# Patient Record
Sex: Male | Born: 1970 | Race: White | Hispanic: No | Marital: Married | Smoking: Current every day smoker
Health system: Southern US, Community
[De-identification: ages and names within clinical notes are randomized; demographics above are authoritative.]

## PROBLEM LIST (undated history)

## (undated) DIAGNOSIS — K760 Fatty (change of) liver, not elsewhere classified: Secondary | ICD-10-CM

## (undated) DIAGNOSIS — J939 Pneumothorax, unspecified: Secondary | ICD-10-CM

## (undated) DIAGNOSIS — K589 Irritable bowel syndrome without diarrhea: Secondary | ICD-10-CM

## (undated) DIAGNOSIS — R112 Nausea with vomiting, unspecified: Secondary | ICD-10-CM

## (undated) DIAGNOSIS — R251 Tremor, unspecified: Secondary | ICD-10-CM

## (undated) DIAGNOSIS — F4310 Post-traumatic stress disorder, unspecified: Secondary | ICD-10-CM

## (undated) DIAGNOSIS — F419 Anxiety disorder, unspecified: Secondary | ICD-10-CM

## (undated) DIAGNOSIS — R51 Headache: Secondary | ICD-10-CM

## (undated) DIAGNOSIS — F32A Depression, unspecified: Secondary | ICD-10-CM

## (undated) DIAGNOSIS — F329 Major depressive disorder, single episode, unspecified: Secondary | ICD-10-CM

## (undated) DIAGNOSIS — K648 Other hemorrhoids: Secondary | ICD-10-CM

## (undated) DIAGNOSIS — K219 Gastro-esophageal reflux disease without esophagitis: Secondary | ICD-10-CM

## (undated) DIAGNOSIS — R519 Headache, unspecified: Secondary | ICD-10-CM

## (undated) DIAGNOSIS — G8929 Other chronic pain: Secondary | ICD-10-CM

## (undated) DIAGNOSIS — G4730 Sleep apnea, unspecified: Secondary | ICD-10-CM

## (undated) DIAGNOSIS — K529 Noninfective gastroenteritis and colitis, unspecified: Secondary | ICD-10-CM

## (undated) HISTORY — DX: Headache: R51

## (undated) HISTORY — DX: Headache, unspecified: R51.9

## (undated) HISTORY — DX: Gastro-esophageal reflux disease without esophagitis: K21.9

## (undated) HISTORY — DX: Sleep apnea, unspecified: G47.30

## (undated) HISTORY — DX: Other hemorrhoids: K64.8

## (undated) HISTORY — PX: NECK SURGERY: SHX720

## (undated) HISTORY — DX: Nausea with vomiting, unspecified: R11.2

## (undated) HISTORY — PX: UPPER GASTROINTESTINAL ENDOSCOPY: SHX188

## (undated) HISTORY — DX: Fatty (change of) liver, not elsewhere classified: K76.0

## (undated) HISTORY — DX: Depression, unspecified: F32.A

## (undated) HISTORY — PX: BACK SURGERY: SHX140

## (undated) HISTORY — PX: WISDOM TOOTH EXTRACTION: SHX21

## (undated) HISTORY — DX: Other chronic pain: G89.29

## (undated) HISTORY — DX: Pneumothorax, unspecified: J93.9

## (undated) HISTORY — DX: Major depressive disorder, single episode, unspecified: F32.9

## (undated) HISTORY — PX: TONSILLECTOMY: SUR1361

## (undated) HISTORY — DX: Anxiety disorder, unspecified: F41.9

## (undated) HISTORY — DX: Post-traumatic stress disorder, unspecified: F43.10

## (undated) HISTORY — DX: Irritable bowel syndrome, unspecified: K58.9

## (undated) HISTORY — DX: Noninfective gastroenteritis and colitis, unspecified: K52.9

---

## 1993-01-16 HISTORY — PX: IMPLANTATION / PLACEMENT EPIDURAL NEUROSTIMULATOR ELECTRODES: SUR687

## 1993-01-16 HISTORY — PX: LUNG REMOVAL, PARTIAL: SHX233

## 2009-01-16 DIAGNOSIS — K648 Other hemorrhoids: Secondary | ICD-10-CM

## 2009-01-16 HISTORY — DX: Other hemorrhoids: K64.8

## 2009-08-02 ENCOUNTER — Emergency Department (HOSPITAL_COMMUNITY): Admission: EM | Admit: 2009-08-02 | Discharge: 2009-08-03 | Payer: Self-pay | Admitting: Emergency Medicine

## 2009-08-10 ENCOUNTER — Ambulatory Visit: Payer: Self-pay | Admitting: Gastroenterology

## 2009-08-10 ENCOUNTER — Encounter: Payer: Self-pay | Admitting: Gastroenterology

## 2009-08-10 DIAGNOSIS — K219 Gastro-esophageal reflux disease without esophagitis: Secondary | ICD-10-CM

## 2009-08-10 DIAGNOSIS — K589 Irritable bowel syndrome without diarrhea: Secondary | ICD-10-CM

## 2009-08-10 DIAGNOSIS — R1310 Dysphagia, unspecified: Secondary | ICD-10-CM | POA: Insufficient documentation

## 2009-08-19 ENCOUNTER — Ambulatory Visit (HOSPITAL_COMMUNITY): Admission: RE | Admit: 2009-08-19 | Discharge: 2009-08-19 | Payer: Self-pay | Admitting: Gastroenterology

## 2009-08-19 ENCOUNTER — Ambulatory Visit: Payer: Self-pay | Admitting: Gastroenterology

## 2009-09-22 ENCOUNTER — Emergency Department (HOSPITAL_COMMUNITY)
Admission: EM | Admit: 2009-09-22 | Discharge: 2009-09-22 | Payer: Self-pay | Source: Home / Self Care | Admitting: Emergency Medicine

## 2009-11-12 ENCOUNTER — Encounter (INDEPENDENT_AMBULATORY_CARE_PROVIDER_SITE_OTHER): Payer: Self-pay | Admitting: *Deleted

## 2009-12-31 ENCOUNTER — Emergency Department (HOSPITAL_COMMUNITY)
Admission: EM | Admit: 2009-12-31 | Discharge: 2009-12-31 | Payer: Self-pay | Source: Home / Self Care | Admitting: Emergency Medicine

## 2010-02-15 NOTE — Letter (Signed)
Summary: Recall Office Visit  [facility]  [address]   [place], [place] [address]   Phone: [phone]  Fax: [phone]      November 12, 2009   EVO ADERMAN [address] [place], [place]  [address] 21-Nov-1970   Dear Mr. Duling,   According to our records, it is time for you to schedule a follow-up office visit with [place].   At your convenience, please call [phone] to schedule an office visit. If you have any questions, concerns, or feel that this letter is in error, we would appreciate your call.   Sincerely,    Diana Eves  [facility] Ph: [phone]   Fax: [phone]

## 2010-02-15 NOTE — Letter (Signed)
Summary: REFERRAL FROM [facility]  REFERRAL FROM [facility] TYSINGER,PA-C   Imported By: Rexene Alberts 08/10/2009 16:39:02  _____________________________________________________________________  External Attachment:    Type:   Image     Comment:   External Document

## 2010-02-15 NOTE — Letter (Signed)
Summary: Internal Other Norman Zamora FOR EGD/ED  Internal Other Norman Zamora FOR EGD/ED   Imported By: Cloria Spring LPN 45/40/9811 91:47:82  _____________________________________________________________________  External Attachment:    Type:   Image     Comment:   External Document

## 2010-02-15 NOTE — Assessment & Plan Note (Signed)
Summary: ABD PAIN,VOMITING,NAUSEA,DYSPHAGIA/SS   Visit Type:  Consult Referring Provider:  [facility], Norman Zamora Primary Care Provider:  [facility]  Chief Complaint:  abd pain, N/V, and difficultly swallowing.  History of Present Illness: Norman Zamora is a pleasant 40 y/o WM, patient of Norman Covey, Norman Zamora, who presents for further evaluation of abd pain, diarrhea, dysphagia, N/V. He has h/o IBS diagnosed while in [place]. He reports prior TCS in 2009 which was unremarkable. He rarely has constipation. BM 2-3 times per day, always soft/loose since on dicyclomine. Before he had up to 5 per day. Some nocturnal BMs, three times in last two weeks. Abd cramps. Some intermittent brbpr. Swallowing getting worse, now having trouble swallowing pills, breads, meats. Heartburn getting worse. Wakes up in AM with regurgitation, also with bending over. N/V not that bad lately. Sometimes related to certain foods. Lower abd pain, sharp, constant, kind of crampy. Associated with fecal urgency. Takes awhile after BM, then pain gets better. Weight up/down.   Multiple EGD/EDs in [place]. Last one EGD/ED about one year ago.   CT A/P 3/11-->fatty liver Labs 3/11-->Celiac panel negative, H.Pylori serologies negative, CMET normal, lipase normal, CBC unremarkable.  Right middle lobe lung mass seen on Chest CTA couple weeks ago at [facility]. Could be infectious, inflammatory, or carcinoma. Treated with Zithromas. Patient with less chest discomfort/sob. Per patient, supposed to have 2-3 f/u Chest CT.   Current Medications (verified): 1)  Meloxicam 7.5 Mg Tabs (Meloxicam) .... Two Times A Day 2)  Sertraline Hcl 100 Mg Tabs (Sertraline Hcl) .... Once Daily 3)  Divalproex Sodium 500 Mg Tbec (Divalproex Sodium) .... 2 Two Times A Day 4)  Dicyclomine Hcl 10 Mg Caps (Dicyclomine Hcl) .... Qid 5)  Valacyclovir Hcl 1 Gm Tabs (Valacyclovir Hcl) .... Once Daily 6)  Tramadol Hcl 50 Mg  Tabs (Tramadol Hcl) .... Three Times A Day 7)  Omeprazole 40 Mg Cpdr (Omeprazole) .... Once Daily 8)  Risperidone 0.5 Mg Tbdp (Risperidone) .... 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 Done in [place]  Past Surgical History: Neurostimulator implant, 1995 Spontaneous pneumothorax, left Vertebral fusion C3-4, C-7, 2004/2005 Tonsillectomy Wisdom tooth extraction  Family History: Mother, GERD, spontaneous pneumothorax Maternal cousin, colon cancer, dx 30s Maternal GF, throat cancer  Social History: Disabled. Married. 2 Daughters. One PPD. No alcohol now. 7-8 years ago drank more regularly. No drug use.   Review of Systems General:  Denies fever, chills, sweats, anorexia, fatigue, weakness, and weight loss. Eyes:  Denies vision loss. ENT:  Complains of difficulty swallowing; denies nasal congestion, sore throat, and hoarseness. CV:  Denies chest pains, angina, palpitations, dyspnea on exertion, and peripheral edema. Resp:  Complains of wheezing; denies dyspnea at rest, dyspnea with exercise, cough, and sputum. GI:  See HPI. GU:  Denies urinary burning and blood in urine. MS:  chronic neck pain. Derm:  Denies rash and itching. Neuro:  Denies weakness, frequent headaches, memory loss, and confusion. Psych:  Complains of depression and anxiety; denies memory loss, suicidal ideation, and hallucinations. Endo:  Denies unusual weight change. Heme:  Denies bruising and bleeding. Allergy:  Denies hives and rash.  Vital Signs:  Patient profile:   40 year old male Height:      75 inches Weight:  184 pounds BMI:     23.08 Temp:     98.0 degrees F oral Pulse rate:   60 / minute BP sitting:   112 / 78   (left arm) Cuff size:   regular  Vitals Entered By: Hendricks Limes LPN (August 10, 2009 10:00 AM)  Physical Exam  General:  Well developed, well nourished, no acute distress. Head:  Normocephalic and atraumatic. Eyes:  Conjunctivae pink, no scleral icterus.  Mouth:  Oropharyngeal mucosa moist, pink.  No lesions, erythema or exudate.    Neck:  Supple; no masses or thyromegaly. Lungs:  wheezes bilateral.   Heart:  Regular rate and rhythm; no murmurs, rubs,  or bruits. Abdomen:  Lower abd tenderness, mild.normal bowel sounds, without guarding, without rebound, no hernia, no masses, and no hepatomegally or splenomegaly.   Extremities:  No clubbing, cyanosis, edema or deformities noted. Neurologic:  Alert and  oriented x4;  grossly normal neurologically. Skin:  Intact without significant lesions or rashes. Cervical Nodes:  No significant cervical adenopathy. Psych:  Alert and cooperative. Normal mood and affect.  Impression & Recommendations:  Problem # 1:  DYSPHAGIA UNSPECIFIED (ICD-787.20)  Recurrent dysphagia with h/o multiple dilations in the past which help. Symptoms recurrent the last few months. Recommend EGD/ED.  Risks, alternatives, benefits including but not limited to risk of reaction to medications, bleeding, infection, and perforation addressed.  Patient voiced understanding and verbal consent obtained. Patient describes inadequate conscious sedation before. Due to this and polypharmacy, psychiatric disease recommend procedure to be done in OR.   Orders: Consultation Level IV (69678)  Problem # 2:  GERD (ICD-530.81)  Refractory symptoms. EGD as planned. Continue omeprazole for now. He has been on Mobic for 3-4 months. Will exclude NSAID induced PUD, etc. given intermittent N/V.  Orders: Consultation Level IV ([id number])  Problem # 3:  IBS (ICD-564.1)  Chronic diarrhea with previous diagnosis of IBS. Symptoms are better on Bentyl but morning symptoms the worse. Celiac screen  neg. Reportedly previous TCS negative. CT this year showed normal TI. He will increase his Bentyl to 20mg  at bedtime and/or before breakfast to try to slow down the number of stools at this time of day. New RX provided. Will add Probiotics for four weeks, samples provided. If he continues to have refractory symptoms, will discuss with Dr. Darrick Penna possibility of repeat TCS vs change in therapy.  Orders: Consultation Level IV ([id number]) Prescriptions: DICYCLOMINE HCL 10 MG CAPS (DICYCLOMINE HCL) one to two by mouth qid prn  #240 x 3   Entered and Authorized by:   Leanna Battles. Dixon Boos   Signed by:   Leanna Battles Darsha Zumstein Norman Zamora on 08/10/2009   Method used:   Electronically to        [facility]  [address] (retail)       [address]       [place], [place]  [address]       Ph: [phone]       Fax: [phone]   RxID:   [id number]  I would like to thank Norman Covey, Norman Zamora for allowing [place] to take part in the care of this nice patient.

## 2010-04-02 LAB — BASIC METABOLIC PANEL
BUN: 15 mg/dL (ref 6–23)
CO2: 23 mEq/L (ref 19–32)
GFR calc non Af Amer: >60 mL/min (ref >60)
Glucose, Bld: 88 mg/dL (ref 70–99)
Potassium: 4.2 mEq/L (ref 3.5–5.1)

## 2010-06-13 ENCOUNTER — Other Ambulatory Visit: Payer: Self-pay | Admitting: Gastroenterology

## 2010-06-14 NOTE — Telephone Encounter (Signed)
Addended by: Joselyn Arrow on: 06/14/2010 12:57 PM   Modules accepted: Orders

## 2010-06-14 NOTE — Telephone Encounter (Signed)
Needs OV prior to further refills. 

## 2010-06-15 NOTE — Telephone Encounter (Signed)
Pt is aware of OV on 5/31 at 1030 with SF to further his refills

## 2010-06-16 ENCOUNTER — Ambulatory Visit (INDEPENDENT_AMBULATORY_CARE_PROVIDER_SITE_OTHER): Payer: Medicare Other | Admitting: Gastroenterology

## 2010-06-16 ENCOUNTER — Encounter: Payer: Self-pay | Admitting: Gastroenterology

## 2010-06-16 VITALS — BP 128/78 | HR 66 | Temp 98.5°F | Ht 76.0 in | Wt 204.6 lb

## 2010-06-16 DIAGNOSIS — R1310 Dysphagia, unspecified: Secondary | ICD-10-CM

## 2010-06-16 DIAGNOSIS — K589 Irritable bowel syndrome without diarrhea: Secondary | ICD-10-CM

## 2010-06-16 DIAGNOSIS — R197 Diarrhea, unspecified: Secondary | ICD-10-CM

## 2010-06-16 DIAGNOSIS — K219 Gastro-esophageal reflux disease without esophagitis: Secondary | ICD-10-CM

## 2010-06-16 DIAGNOSIS — K625 Hemorrhage of anus and rectum: Secondary | ICD-10-CM

## 2010-06-16 MED ORDER — DICYCLOMINE HCL 10 MG PO CAPS: ORAL_CAPSULE | ORAL | Status: DC

## 2010-06-16 NOTE — Assessment & Plan Note (Signed)
Sx not ideally controlled. NO IMPROVEMENT WITH LACTOSE FREE DIET.  Increase Bentyl to qac and hs. Rx given. OPV in 3-6 mos.

## 2010-06-16 NOTE — Assessment & Plan Note (Addendum)
Most likley 2o to hemorrhoids. Differential diagnosis include polyp, colon cancer, and less likely an AVM.  Discussed management options. Pt declined rectal suppositories. Last TCS 2009-report not available. TCS June 18 w/ ? Hemorrhoidal banding-MOVIPREP. Using propofol due to pts multiple psychoactive meds. Follow up appt around July 5 or 6 at [facility] if hemorrhoidal banding performed. Discussed risk of procedure with pt.

## 2010-06-16 NOTE — Patient Instructions (Signed)
Take DICYCLOMINE 30 minutes prior to meals and at bedtime. Will schedule upper endoscopy with stretching and lower endoscopy for banding within next 2 weeks.  FOLLOW recommendations in REFLUX HO. FOLLOW UP AROUND June 27 OR June 28.

## 2010-06-16 NOTE — Progress Notes (Signed)
Subjective:    Patient ID: Norman Zamora, male    DOB: Jun 30, 1970, 40 y.o.   MRN: [id number]  PCP: Aleen Campi  HPI hAVING 2-4 BMs/DAY WITH BENTYL 3X/DAY. Pain and cramping-every day. Gets better after BM. No food triggers or stress triggers. No bowel surgeries. On Depakote for depression and anxiety. Some days feels depressed. Has anxiety: at least once a day. Being around people. Saw blood in stool-2 weeks ago. Last TCS 2009: HEMORRHOIDS. WHEN HE WIPES almost everytime he sees blood. Rx: Prep H for  1-1.5 years. 2 weeks ago more blood and with stool. 1oly bleeding, but rectal pain/itching. Mild lwr abd pain. Having problems swallowing pills and food again. Heartburn controlled on OMP. Following a low fat diet. Sx worse after regular coffee. Smoking: 1 pk/day, was smoking almost 2 pks/day.  Past Medical History  Diagnosis Date  . Inflammatory bowel disease 2009 diarrhea predominant  . Hemorrhoids, internal, with bleeding 2011  . Fatty liver 2011 184 lbs  . HA (headache)   . Chronic pain NECK  . Sleep apnea   . Genital herpes   . GERD (gastroesophageal reflux disease)   . Anxiety   . Depression   . PTSD (post-traumatic stress disorder) FROM CHILDHOOD  . Pneumothorax on left     Past Surgical History  Procedure Date  . Implantation / placement epidural neurostimulator electrodes 1995  . Back surgery 2004, 2005  . Upper gastrointestinal endoscopy AUG 2011-diarrhea, dysphagia    NSAID GASTRITIS, NL DUODENUM, ESO DIL  16 MM    No Known Allergies Current Outpatient Prescriptions  Medication Sig Dispense Refill  . clonazePAM (KLONOPIN) 1 MG tablet Take 1 mg by mouth 3 (three) times daily as needed. 1/2 tablet tid       . dicyclomine (BENTYL) 10 MG capsule TID    . divalproex (DEPAKOTE) 500 MG EC tablet Take 500 mg by mouth 2 (two) times daily.        . meloxicam (MOBIC) 7.5 MG tablet Take 7.5 mg by mouth daily.        Marland Kitchen omeprazole (PRILOSEC) 40 MG capsule Take 40 mg by mouth daily.         . propranolol (INDERAL) 60 MG tablet Take 60 mg by mouth 3 (three) times daily.        . traMADol (ULTRAM) 50 MG tablet Take 50 mg by mouth every 6 (six) hours as needed.        . valACYclovir (VALTREX) 1000 MG tablet Take 1,000 mg by mouth 2 (two) times daily.        Marland Kitchen venlafaxine (EFFEXOR) 100 MG tablet Take 100 mg by mouth 2 (two) times daily.        .       Family History  Problem Relation Age of Onset  . Colon cancer Cousin     MATERNAL, 30s   History   Social History  . Marital Status: Married    Spouse Name: N/A    Number of Children: N/A  . Years of Education: N/A   Occupational History  . Not on file.   Social History Main Topics  . Smoking status: Current Everyday Smoker -- 1.0 packs/day    Types: Cigarettes  . Smokeless tobacco: Not on file  . Alcohol Use: No  . Drug Use: No  . Sexually Active: Not on file   Other Topics Concern  . Not on file   Social History Narrative  . No narrative on file  Review of Systems  All other systems reviewed and are negative.   Objective:  Physical Exam  Constitutional: He is oriented to person, place, and time. He appears well-developed and well-nourished. No distress.  HENT:  Head: Normocephalic and atraumatic.  Mouth/Throat: Oropharynx is clear and moist. No oropharyngeal exudate.  Eyes: Pupils are equal, round, and reactive to light.  Neck: Normal range of motion. Neck supple.  Cardiovascular: Normal rate, regular rhythm and normal heart sounds.   Pulmonary/Chest: Effort normal and breath sounds normal.  Abdominal: Soft. Bowel sounds are normal. He exhibits no distension. There is no tenderness.       MILD TTP IN LUQ AND BUQS  Musculoskeletal: He exhibits no edema.  Lymphadenopathy:    He has no cervical adenopathy.  Neurological: He is alert and oriented to person, place, and time.  Skin: Skin is warm and dry.  Psychiatric:       FLAT AFFECT        Assessment & Plan:

## 2010-06-16 NOTE — Assessment & Plan Note (Addendum)
FAIRLY WELL CONTROLLED. Pt still consuming caffeine and smoking. Gained 20 lbs since last year.  Continue PPI. Given GERD lifestyle HO and asked to follow recommendations. Continue OMP. OPV in 3-6 mos.

## 2010-06-16 NOTE — Assessment & Plan Note (Signed)
Most likely to IBS-d. Differential diagnosis includes microscopic colitis and less likely a villous adenoma.  TCS June 18 w/ random Bx.

## 2010-06-16 NOTE — Progress Notes (Signed)
Cc to PCP 

## 2010-06-16 NOTE — Progress Notes (Signed)
Pt is aware of OV to follow up in July

## 2010-06-17 HISTORY — PX: ESOPHAGOGASTRODUODENOSCOPY: SHX1529

## 2010-06-17 HISTORY — PX: COLONOSCOPY: SHX174

## 2010-06-28 ENCOUNTER — Encounter (HOSPITAL_COMMUNITY): Payer: Medicare Other

## 2010-06-28 ENCOUNTER — Other Ambulatory Visit: Payer: Self-pay | Admitting: Anesthesiology

## 2010-06-28 LAB — BASIC METABOLIC PANEL
CO2: 29 mEq/L (ref 19–32)
Chloride: 100 mEq/L (ref 96–112)
Creatinine, Ser: 0.77 mg/dL (ref 0.4–1.5)
GFR calc Af Amer: >60 mL/min (ref >60)
Potassium: 5.2 mEq/L — ABNORMAL HIGH (ref 3.5–5.1)
Sodium: 135 mEq/L (ref 135–145)

## 2010-06-28 LAB — HEMOGLOBIN AND HEMATOCRIT, BLOOD: HCT: 41.4 % (ref 39.0–52.0)

## 2010-06-28 LAB — VALPROIC ACID LEVEL: Valproic Acid Lvl: 81.2 ug/mL (ref 50.0–100.0)

## 2010-07-04 ENCOUNTER — Encounter: Payer: Medicare Other | Admitting: Gastroenterology

## 2010-07-04 ENCOUNTER — Other Ambulatory Visit: Payer: Self-pay | Admitting: Gastroenterology

## 2010-07-04 ENCOUNTER — Ambulatory Visit (HOSPITAL_COMMUNITY)
Admission: RE | Admit: 2010-07-04 | Discharge: 2010-07-04 | Disposition: A | Discharge status: Home / Self Care | Payer: Medicare Other | Source: Ambulatory Visit | Attending: Gastroenterology | Admitting: Gastroenterology

## 2010-07-04 DIAGNOSIS — Z01812 Encounter for preprocedural laboratory examination: Secondary | ICD-10-CM | POA: Insufficient documentation

## 2010-07-04 DIAGNOSIS — K222 Esophageal obstruction: Secondary | ICD-10-CM

## 2010-07-04 DIAGNOSIS — K5730 Diverticulosis of large intestine without perforation or abscess without bleeding: Secondary | ICD-10-CM

## 2010-07-04 DIAGNOSIS — R109 Unspecified abdominal pain: Secondary | ICD-10-CM | POA: Insufficient documentation

## 2010-07-04 DIAGNOSIS — R197 Diarrhea, unspecified: Secondary | ICD-10-CM

## 2010-07-04 DIAGNOSIS — K2940 Chronic atrophic gastritis without bleeding: Secondary | ICD-10-CM | POA: Insufficient documentation

## 2010-07-04 DIAGNOSIS — R1310 Dysphagia, unspecified: Secondary | ICD-10-CM

## 2010-07-04 DIAGNOSIS — K648 Other hemorrhoids: Secondary | ICD-10-CM | POA: Insufficient documentation

## 2010-07-04 DIAGNOSIS — K625 Hemorrhage of anus and rectum: Secondary | ICD-10-CM | POA: Insufficient documentation

## 2010-07-07 NOTE — Progress Notes (Signed)
Cc path to PCP 

## 2010-07-21 ENCOUNTER — Ambulatory Visit: Payer: Medicare Other | Admitting: Gastroenterology

## 2010-07-27 NOTE — Op Note (Signed)
Norman Zamora, NEYHART NO.:  [id number]  MEDICAL RECORD NO.:  [id number]  LOCATION:  DAYP                          FACILITY:  [facility]  PHYSICIAN:  Jonette Eva, M.D.     DATE OF BIRTH:  12/20/1970  DATE OF PROCEDURE:  07/04/2010 DATE OF DISCHARGE:                              OPERATIVE REPORT   REFERRING PROVIDER:  Kristian Covey, PA  PROCEDURE: 1. Colonoscopy with random cold forceps biopsy. 2. Esophagogastroduodenoscopy with cold forceps biopsy gastric and     esophageal mucosa as well as Savary dilation to 60 mm.  INDICATION FOR EXAM:  Norman Zamora is a 40 year old male who was seen and evaluated by me for abdominal pain and diarrhea and dysphagia.  He had an esophageal dilation to 60 mm in August 2011.  He was noted to have 2 distal esophageal webs.  He also had gastritis.  Biopsies were negative for H. pylori gastritis and he had a normal duodenum.  He presented again with rectal bleeding, diarrhea, and abdominal pain.  He continues to smoke and drinks coffee.  There is a diagnosis of irritable bowel syndrome and gastroesophageal reflux disease.  FINDINGS: 1. Frequent diverticula seen from the transverse colon to the sigmoid     colon.  Slightly tortuous colon.  No evidence of polyps, masses, or     inflammatory changes. 2. A small internal hemorrhoids.  Otherwise normal retroflex view of     the rectum. 3. Distal esophageal web and Schatzki ring.  The lumen was __________     14 mm.  The esophagus was dilated from to 14 to 60 mm.  The 60-mm     dilator passed with mild resistance.  No evidence of Barrett mass,     erosions, or ulcerations. 4. Patchy erythema in the antrum without erosion or ulceration.     Biopsies obtained via cold forceps to evaluate for H. pylori     gastritis. 5. Normal duodenal bulb and second portion of the duodenum. 6. The distal esophageal biopsies obtained 40 cm from the teeth.     Proximal biopsies obtained 25 cm from the  teeth.  DIAGNOSES: 1. Dysphagia is multifactorial:  Gastroesophageal reflux disease,     distal esophageal webs/ring. 2. Mild gastritis. 3. Small internal hemorrhoids, no indication for endoscopic banding. 4. Moderate diverticulosis.  RECOMMENDATIONS: 1. Screening colonoscopy in 10 years. 2. Call with results of hi biopsies. 3. No aspirin, NSAIDs or anticoagulation for 3 days. 4. He should follow a high-fiber diet.  He was given a handout on high-     fiber diet, diverticulosis, and hemorrhoids. 5. He should continue his omeprazole and take 30 minutes prior to     meals. 6. He should follow up in 3 months regarding diarrhea and rectal     bleeding. 7. He will be given information on reflux and gastritis.  MEDICATIONS:  Propofol provided by anesthesia.  PROCEDURE TECHNIQUE:  Physical exam was performed.  Informed consent was obtained from the patient after explaining the benefits, risks and alternatives to the procedure.  The patient was connected to the monitor and placed in left lateral position.  Continuous oxygen was provided by nasal cannula and IV medicine  administered through an indwelling cannula.  After administration of sedation and rectal exam, the patient's rectum was intubated and the scope was advanced under direct visualization to the cecum.  Scope was removed slowly by carefully examining the color, texture, anatomy, and integrity of the mucosa on the way out.  After colonoscopy, the patient's esophagus was intubated with a diagnostic gastroscope.  The scope was advanced under direct visualization to the second portion of the duodenum.  Scope was removed slowly by carefully examining, the color, texture, anatomy and integrity of the mucosa on the way out.  Prior to withdrawal of the scope, the retroflex view of the cardia was performed and the Savary guidewire was advanced.  The esophagus with dilation to 14-60 mm.  The 60-mm dilator passed with mild  resistance.  The patient was recovered in endoscopy and discharged home in satisfactory condition.  PATH: NL COLON & ESOPHAGUS, MILD CHRONIC GASTRITIS   Jonette Eva, M.D.     SF/MEDQ  D:  07/04/2010  T:  07/05/2010  Job:  [id number]  Electronically Signed by Jonette Eva M.D. on 07/27/2010 10:17:29 AM

## 2010-08-21 ENCOUNTER — Encounter (HOSPITAL_COMMUNITY): Payer: Self-pay | Admitting: Emergency Medicine

## 2010-08-21 ENCOUNTER — Emergency Department (HOSPITAL_COMMUNITY)
Admission: EM | Admit: 2010-08-21 | Discharge: 2010-08-21 | Disposition: A | Discharge status: Home / Self Care | Payer: Medicare Other | Attending: Emergency Medicine | Admitting: Emergency Medicine

## 2010-08-21 DIAGNOSIS — G8929 Other chronic pain: Secondary | ICD-10-CM

## 2010-08-21 DIAGNOSIS — K219 Gastro-esophageal reflux disease without esophagitis: Secondary | ICD-10-CM | POA: Insufficient documentation

## 2010-08-21 DIAGNOSIS — M542 Cervicalgia: Secondary | ICD-10-CM | POA: Insufficient documentation

## 2010-08-21 DIAGNOSIS — F17200 Nicotine dependence, unspecified, uncomplicated: Secondary | ICD-10-CM | POA: Insufficient documentation

## 2010-08-21 DIAGNOSIS — F341 Dysthymic disorder: Secondary | ICD-10-CM | POA: Insufficient documentation

## 2010-08-21 DIAGNOSIS — A6000 Herpesviral infection of urogenital system, unspecified: Secondary | ICD-10-CM | POA: Insufficient documentation

## 2010-08-21 MED ORDER — DEXAMETHASONE SODIUM PHOSPHATE 4 MG/ML IJ SOLN
10.0000 mg | Freq: Once | INTRAMUSCULAR | Status: AC
  Administered 2010-08-21: 10 mg via INTRAMUSCULAR
  Filled 2010-08-21: qty 3

## 2010-08-21 MED ORDER — KETOROLAC TROMETHAMINE 60 MG/2ML IM SOLN
60.0000 mg | Freq: Once | INTRAMUSCULAR | Status: AC
  Administered 2010-08-21: 60 mg via INTRAMUSCULAR
  Filled 2010-08-21: qty 2

## 2010-08-21 MED ORDER — OXYCODONE-ACETAMINOPHEN 5-325 MG PO TABS: 1.0000 | ORAL_TABLET | ORAL | Status: AC | PRN

## 2010-08-21 NOTE — ED Notes (Signed)
Pt c/o neck pain worse radiating into shoulder worse with movement. Also c/o pain in left side of head. Pt states some numbness/tingling from "time to time" . History c3-c4  And c6-c7 bone fusions. Pt states he also has a neuro-stimulator implant. nad noted.

## 2010-08-21 NOTE — ED Provider Notes (Signed)
History     CSN: [id number] Arrival date & time: 08/21/2010  6:20 PM  Chief Complaint  Patient presents with  . Neck Pain   HPI Comments: Patient presents with history of neck pain. This has been ongoing for the last 8 years and has had surgery at a prior hospital in [place] in 2005 at 2006. He notes that 2 weeks ago the pain recurred and has been fairly constant in his left neck radiating to his left shoulder left for head. He describes it as a sharp and shooting pain. There is no associated nausea vomiting or numbness or weakness of the left upper extremity. This is similar to the pain that he had in the past. He notes that he has had a neurostimulator implant but that seems to stop working. His family Dr. has been treating him with tramadol and meloxicam with minimal improvement. Symptoms are constant, moderate at this time. It is worse with movement of the left arm. It is worse with rotation of the head.  Patient is a 40 y.o. male presenting with neck pain. The history is provided by the patient.  Neck Pain  Pertinent negatives include no chest pain, no numbness and no weakness.    Past Medical History  Diagnosis Date  . Inflammatory bowel disease 2009 diarrhea predominant  . Hemorrhoids, internal, with bleeding 2011  . Fatty liver 2011 184 lbs  . HA (headache)   . Chronic pain NECK  . Sleep apnea   . Genital herpes   . GERD (gastroesophageal reflux disease)   . Anxiety   . Depression   . PTSD (post-traumatic stress disorder) FROM CHILDHOOD  . Pneumothorax on left     Past Surgical History  Procedure Date  . Implantation / placement epidural neurostimulator electrodes 1995  . Back surgery 2004, 2005  . Upper gastrointestinal endoscopy AUG 2011-diarrhea, dysphagia    NSAID GASTRITIS, NL DUODENUM, ESO DIL  16 MM    Family History  Problem Relation Age of Onset  . Colon cancer Cousin     MATERNAL, 30s    History  Substance Use Topics  . Smoking status: Current  Everyday Smoker -- 1.0 packs/day    Types: Cigarettes  . Smokeless tobacco: Not on file  . Alcohol Use: No      Review of Systems  Constitutional: Negative for fever.  HENT: Positive for neck pain.   Respiratory: Negative for cough and shortness of breath.   Cardiovascular: Negative for chest pain.  Gastrointestinal: Negative for nausea and vomiting.  Skin: Negative for rash.  Neurological: Negative for weakness and numbness.    Physical Exam  BP 126/70  Pulse 63  Temp(Src) 97.9 F (36.6 C) (Oral)  Resp 20  Ht 6\' 4"  (1.93 m)  Wt 205 lb (92.987 kg)  BMI 24.95 kg/m2  SpO2 100%  Physical Exam  Nursing note and vitals reviewed. Constitutional: He appears well-developed and well-nourished. No distress.  HENT:  Head: Normocephalic and atraumatic.  Mouth/Throat: Oropharynx is clear and moist. No oropharyngeal exudate.  Eyes: Conjunctivae and EOM are normal. Pupils are equal, round, and reactive to light. Right eye exhibits no discharge. Left eye exhibits no discharge. No scleral icterus.  Neck: Normal range of motion. Neck supple. No JVD present. No thyromegaly present.  Cardiovascular: Normal rate, regular rhythm, normal heart sounds and intact distal pulses.  Exam reveals no gallop and no friction rub.   No murmur heard. Pulmonary/Chest: Effort normal and breath sounds normal. No respiratory distress. He has  no wheezes. He has no rales.  Abdominal: Soft. Bowel sounds are normal. He exhibits no distension and no mass. There is no tenderness.  Musculoskeletal: Normal range of motion. He exhibits no edema and no tenderness.       No edema, normal grip and strength of the left upper extremity. Decreased range of motion at the shoulder secondary to pain in the neck in the rhomboid area. Tenderness to palpation in the left neck paraspinal muscles, left trapezius, left rhomboid.  Lymphadenopathy:    He has no cervical adenopathy.  Neurological: He is alert. Coordination normal.        Normal sensation to light touch and pinprick, normal grip normal strength in the left upper cavity.  Skin: Skin is warm and dry. No rash noted. He is not diaphoretic. No erythema.  Psychiatric: He has a normal mood and affect. His behavior is normal.    ED Course  Procedures  MDM Patient has chronic pain likely has radicular pain. Will treat with Decadron and Toradol and said that which were prescription for narcotic pain medications. He will followup with his family Dr.Tysinger in the next 3 days for repeat evaluation and lower for to neurosurgery for ongoing complaints and possible reevaluation for repeat surgery. patient has expressed understanding for indications for followup.      Vida Roller, MD 08/21/10 [phone]

## 2010-08-21 NOTE — ED Notes (Signed)
Pt states he has had pain for long time and is worse for 2 weeks.

## 2010-10-05 ENCOUNTER — Encounter: Payer: Self-pay | Admitting: Gastroenterology

## 2010-10-05 ENCOUNTER — Ambulatory Visit (INDEPENDENT_AMBULATORY_CARE_PROVIDER_SITE_OTHER): Payer: Medicare Other | Admitting: Gastroenterology

## 2010-10-05 VITALS — BP 136/83 | HR 53 | Temp 97.4°F | Ht 75.0 in | Wt 200.2 lb

## 2010-10-05 DIAGNOSIS — R1011 Right upper quadrant pain: Secondary | ICD-10-CM

## 2010-10-05 DIAGNOSIS — R1110 Vomiting, unspecified: Secondary | ICD-10-CM

## 2010-10-05 DIAGNOSIS — R634 Abnormal weight loss: Secondary | ICD-10-CM | POA: Insufficient documentation

## 2010-10-05 DIAGNOSIS — K589 Irritable bowel syndrome without diarrhea: Secondary | ICD-10-CM

## 2010-10-05 DIAGNOSIS — K219 Gastro-esophageal reflux disease without esophagitis: Secondary | ICD-10-CM

## 2010-10-05 MED ORDER — ALIGN 4 MG PO CAPS: 4.0000 mg | ORAL_CAPSULE | Freq: Every day | ORAL | Status: DC

## 2010-10-05 MED ORDER — DEXLANSOPRAZOLE 60 MG PO CPDR: 60.0000 mg | DELAYED_RELEASE_CAPSULE | Freq: Every day | ORAL | Status: AC

## 2010-10-05 NOTE — Assessment & Plan Note (Signed)
Labs and abd u/s as planned.

## 2010-10-05 NOTE — Progress Notes (Signed)
Cc to PCP 

## 2010-10-05 NOTE — Assessment & Plan Note (Signed)
See vomiting assessment and plan. R/O cholecystitis/biliary symptoms.

## 2010-10-05 NOTE — Assessment & Plan Note (Signed)
Refractory symptoms on omeprazole 40mg  daily. Never tried other PPIs. EGD as outlined above.  Change to Dexilant. Samples and RX given.

## 2010-10-05 NOTE — Assessment & Plan Note (Addendum)
Some intermittent diarrhea on Bentyl but somewhat better. Continue high fiber diet. Add probiotic. Align one daily for four weeks. Forgot to give samples at time of OV. Will call patient.

## 2010-10-05 NOTE — Progress Notes (Signed)
LMOM to call.

## 2010-10-05 NOTE — Assessment & Plan Note (Signed)
Four pounds down since last OV. Decreased PO intake due to pp abd pain and pp vomiting. Check labs and abd u/s. If unrevealing, consider GES.

## 2010-10-05 NOTE — Progress Notes (Signed)
Primary Care Physician:  Dr. Ernestine Conrad Primary Gastroenterologist: Jonette Eva, MD    Chief Complaint  Patient presents with  . Follow-up    some better but not eating good only one meal a day    HPI: Norman Zamora is a 40 y.o. male here for follow-up. He has h/o IBS with diarrhea, intermittent rectal bleeding, GERD, esophageal dysphagia. EGD/TCS as outlined below. States his dysphagia has resolved s/p dilation of esophagus. Abdominal pain may be a little bit better on Bentyl. Having heartburn frequently on omeprazole 40mg  daily. (never took other PPIs) Still with regurgitation. Notices more with bending over and eating. Does better if only eats once daily. Has modified diet. Eliminated most caffeine. No fried foods. Increased fiber. Still eats popcorn occasionally which worsens abd pain. Describes upper abdominal pain, worse with meals. Feels like "poop" pain. Sometimes better after BM. Other times nothing seems to make it better and it will last into the evening. No nocturnal symptoms.    Weight down four pounds. Gag with tooth brushing for 2-3 years. Eat only once daily because of pp nausea. Taking Bentyl regularly. First BM regular but followed by 2-3 loose stools. No melena. BRBPR on tissue intermittently. Usually with solid stool. Every day, take meds, starts to feel nauseated.     Current Outpatient Prescriptions  Medication Sig Dispense Refill  . clonazePAM (KLONOPIN) 1 MG tablet Take 1 mg by mouth 3 (three) times daily as needed. 1/2 tablet tid       . dicyclomine (BENTYL) 10 MG capsule 1-2 PO 30 MINUTES PRIOR TO MEALS AND AT BEDTIME. No more than 8 pills a day.  90 capsule  11  . divalproex (DEPAKOTE) 500 MG EC tablet Take 500 mg by mouth 2 (two) times daily.        . meloxicam (MOBIC) 7.5 MG tablet Take 7.5 mg by mouth 2 (two) times daily.       . propranolol (INDERAL) 60 MG tablet Take 60 mg by mouth 3 (three) times daily.        . traMADol (ULTRAM) 50 MG tablet Take 50 mg  by mouth every 6 (six) hours as needed.        . valACYclovir (VALTREX) 1000 MG tablet Take 1,000 mg by mouth 2 (two) times daily.        Marland Kitchen venlafaxine (EFFEXOR) 100 MG tablet Take 100 mg by mouth 2 (two) times daily.        Marland Kitchen dexlansoprazole (DEXILANT) 60 MG capsule Take 1 capsule (60 mg total) by mouth daily.  30 capsule  5    Allergies as of 10/05/2010  . (No Known Allergies)   Past Surgical History  Procedure Date  . Implantation / placement epidural neurostimulator electrodes 1995  . Back surgery 2004, 2005  . Upper gastrointestinal endoscopy AUG 2011-diarrhea, dysphagia    NSAID GASTRITIS, NL DUODENUM, ESO DIL  16 MM  . Esophagogastroduodenoscopy 06/2010    distal esophageal web and ring dilated, patchy antral erythema bx showed mild chronic gastritis and no H.Pylori, distal esophageal bx was negative  . Colonoscopy 06/2010    frequent diverticula transverse colon to sigmoid colon, small internal hemorrhoid, slightly tortuous, random bx were negative. Next TCS 06/2020  . Neck surgery   . Tonsillectomy   . 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 Left lung.    ROS:  General: Negative for, fever, chills, fatigue, weakness. See HPI. ENT: Negative for hoarseness, difficulty swallowing , nasal congestion.  CV: Negative for chest pain, angina, palpitations, dyspnea on exertion, peripheral edema.  Respiratory: Negative for dyspnea at rest, dyspnea on exertion, cough, sputum, wheezing.  GI: See history of present illness. GU:  Negative for dysuria, hematuria, urinary incontinence, urinary frequency, nocturnal urination.  Endo:   See HPI.   Physical Examination:   BP 136/83  Pulse 53  Temp(Src) 97.4 F (36.3 C) (Temporal)  Ht 6\' 3"  (1.905 m)  Wt 200 lb 3.2 oz (90.81 kg)  BMI 25.02 kg/m2  General: Well-nourished, well-developed in no acute distress.  Eyes: No icterus. Mouth: Oropharyngeal mucosa moist and pink , no lesions erythema or exudate. Lungs: Clear  to auscultation bilaterally.  Heart: Regular rate and rhythm, no murmurs rubs or gallops.  Abdomen: Bowel sounds are normal, nondistended, no hepatosplenomegaly or masses, no abdominal bruits or hernia , no rebound or guarding.  Moderate ruq/epigastric tenderness. Mild diffuse abd tenderness to deep palpation. Extremities: No lower extremity edema. No clubbing or deformities. Neuro: Alert and oriented x 4   Skin: Warm and dry, no jaundice.   Psych: Alert and cooperative, normal mood and affect.

## 2010-10-05 NOTE — Patient Instructions (Signed)
I have decided to change your omeprazole to Dexilant 60mg  daily (30 minutes before breakfast) since you were already taking 40mg  daily of omeprazole. New RX sent to pharmacy and we have provided you with samples. Stop omeprazole. Call in two weeks and let us know how you are doing with regards to you heartburn, vomiting, abdominal pain.  We have scheduled you to have ultrasound of your gallbladder. Please do your lab work.

## 2010-10-06 ENCOUNTER — Telehealth: Payer: Self-pay

## 2010-10-06 LAB — HEPATIC FUNCTION PANEL
Alkaline Phosphatase: 49 U/L (ref 39–117)
Bilirubin, Direct: 0.1 mg/dL (ref 0.0–0.3)
Indirect Bilirubin: 0.4 mg/dL (ref 0.0–0.9)
Total Bilirubin: 0.5 mg/dL (ref 0.3–1.2)

## 2010-10-06 LAB — CBC WITH DIFFERENTIAL/PLATELET
Basophils Relative: 1 % (ref 0–1)
Eosinophils Absolute: 0.3 10*3/uL (ref 0.0–0.7)
HCT: 42.3 % (ref 39.0–52.0)
Hemoglobin: 14.3 g/dL (ref 13.0–17.0)
Lymphs Abs: 4.3 10*3/uL — ABNORMAL HIGH (ref 0.7–4.0)
MCH: 33.4 pg (ref 26.0–34.0)
MCHC: 33.8 g/dL (ref 30.0–36.0)
MCV: 98.8 fL (ref 78.0–100.0)
Monocytes Absolute: 0.7 10*3/uL (ref 0.1–1.0)
Monocytes Relative: 10 % (ref 3–12)
RBC: 4.28 MIL/uL (ref 4.22–5.81)

## 2010-10-06 LAB — BASIC METABOLIC PANEL
BUN: 11 mg/dL (ref 6–23)
CO2: 27 mEq/L (ref 19–32)
Calcium: 9.4 mg/dL (ref 8.4–10.5)
Glucose, Bld: 86 mg/dL (ref 70–99)
Sodium: 141 mEq/L (ref 135–145)

## 2010-10-06 LAB — LIPASE: Lipase: 16 U/L (ref 0–75)

## 2010-10-06 NOTE — Telephone Encounter (Signed)
How much would it be with the rebate card?

## 2010-10-06 NOTE — Progress Notes (Signed)
Pt returned call and was informed of his Align samples this AM.

## 2010-10-06 NOTE — Telephone Encounter (Signed)
Pt returned call and was informed we had Align samples for him (per Tana [place], [place] ). He also said that the Dexilant was going to cost him $70.00 and he cannot afford that. He will be taking samples given, please advise on what other PPI he can do.

## 2010-10-07 ENCOUNTER — Ambulatory Visit (HOSPITAL_COMMUNITY)
Admission: RE | Admit: 2010-10-07 | Discharge: 2010-10-07 | Disposition: A | Discharge status: Home / Self Care | Payer: Medicare Other | Source: Ambulatory Visit | Attending: Gastroenterology | Admitting: Gastroenterology

## 2010-10-07 DIAGNOSIS — R1011 Right upper quadrant pain: Secondary | ICD-10-CM

## 2010-10-07 DIAGNOSIS — R109 Unspecified abdominal pain: Secondary | ICD-10-CM | POA: Insufficient documentation

## 2010-10-07 DIAGNOSIS — R112 Nausea with vomiting, unspecified: Secondary | ICD-10-CM | POA: Insufficient documentation

## 2010-10-07 DIAGNOSIS — R634 Abnormal weight loss: Secondary | ICD-10-CM | POA: Insufficient documentation

## 2010-10-07 DIAGNOSIS — R1110 Vomiting, unspecified: Secondary | ICD-10-CM

## 2010-10-07 MED ORDER — PANTOPRAZOLE SODIUM 40 MG PO TBEC: 40.0000 mg | DELAYED_RELEASE_TABLET | Freq: Every day | ORAL | Status: DC

## 2010-10-07 NOTE — Telephone Encounter (Signed)
LMOM for pt that new Rx has been sent in.

## 2010-10-07 NOTE — Telephone Encounter (Signed)
Sent RX for pantoprazole, let's see if he is cheaper. Pt failed omeprazole 40mg  daily.

## 2010-10-07 NOTE — Telephone Encounter (Signed)
Pt has Medicare, cannot use the rebate cards.

## 2010-10-10 NOTE — Progress Notes (Signed)
Quick Note:  LMOM to call. ______ 

## 2010-10-10 NOTE — Progress Notes (Signed)
Quick Note:  Pt informed ______ 

## 2010-10-10 NOTE — Progress Notes (Signed)
Quick Note:  abd u/s looks good. No gallstones.  Let's see how he does with change in PPI. OV in four to six weeks. ______

## 2010-10-10 NOTE — Progress Notes (Signed)
Quick Note:  Labs look good. Await abd u/s. ______

## 2010-10-10 NOTE — Progress Notes (Signed)
Pt called back to get results of labs and [place]. Told him labs looked good and no gallstones on his [place] and he stated that he was doing good with the PPI. He will need an follow up in 4-6 wks.

## 2010-11-04 ENCOUNTER — Telehealth: Payer: Self-pay | Admitting: Gastroenterology

## 2010-11-04 NOTE — Telephone Encounter (Signed)
Please verify which medicine he is talking about. I'm assuming he is saying the pantoprazole is not helping. We have two choices increase pantoprazole 40mg  po bid. #60. 3 refills. OR try the Dexilant 60mg  once daily (but he complained of expense before). I would recommend increasing pantoprazole.  Keep planned OV.

## 2010-11-04 NOTE — Telephone Encounter (Signed)
Called Pt he would like to try the Pantoprazole 40 mg Bid and he will keep his follow up appointment on the 11/14/10 at 11:00. I called in new Rx to [facility] in [place]

## 2010-11-04 NOTE — Telephone Encounter (Signed)
Pt called this morning to say his medicine isn't helping him. It's causing a lot of heart burn and unable to sleep. He would like for something else to be prescribed if possible. He can be reached on his cell [phone]

## 2010-11-14 ENCOUNTER — Ambulatory Visit: Payer: Medicare Other | Admitting: Gastroenterology

## 2010-11-14 NOTE — Progress Notes (Signed)
PT FAILED OMP 40 MG QD. NEEDS EGD/?BRAVO ON PPI FOR NEW ONSET DYSPEPSIA.

## 2010-11-14 NOTE — Progress Notes (Signed)
ERROR ON PRIOR NOTE: PT NEEDS EGD W/ BRAVO FOR UNCONTROLLED GERD

## 2010-11-15 ENCOUNTER — Telehealth: Payer: Self-pay | Admitting: Gastroenterology

## 2010-11-15 ENCOUNTER — Ambulatory Visit: Payer: Medicare Other | Admitting: Gastroenterology

## 2010-11-15 NOTE — Telephone Encounter (Signed)
Pt was a no show

## 2010-11-23 ENCOUNTER — Encounter: Payer: Self-pay | Admitting: Internal Medicine

## 2010-11-24 ENCOUNTER — Telehealth: Payer: Self-pay | Admitting: Gastroenterology

## 2010-11-24 ENCOUNTER — Ambulatory Visit: Payer: Medicare Other | Admitting: Gastroenterology

## 2010-11-24 NOTE — Telephone Encounter (Signed)
Pt was a no show

## 2010-12-21 NOTE — Telephone Encounter (Signed)
2nd no-show. Needs letter for f/u. Please inform of no-show office policy.

## 2010-12-21 NOTE — Telephone Encounter (Signed)
First no show  

## 2010-12-26 ENCOUNTER — Encounter: Payer: Self-pay | Admitting: Gastroenterology

## 2010-12-26 NOTE — Telephone Encounter (Signed)
Mailed letter for patient to call to [facility] OV °

## 2011-01-03 ENCOUNTER — Ambulatory Visit: Payer: Medicare Other | Admitting: Physical Medicine & Rehabilitation

## 2011-01-04 ENCOUNTER — Encounter: Payer: Self-pay | Admitting: Gastroenterology

## 2011-01-04 ENCOUNTER — Ambulatory Visit (INDEPENDENT_AMBULATORY_CARE_PROVIDER_SITE_OTHER): Payer: Medicare Other | Admitting: Gastroenterology

## 2011-01-04 DIAGNOSIS — R634 Abnormal weight loss: Secondary | ICD-10-CM

## 2011-01-04 DIAGNOSIS — R1011 Right upper quadrant pain: Secondary | ICD-10-CM

## 2011-01-04 DIAGNOSIS — K589 Irritable bowel syndrome without diarrhea: Secondary | ICD-10-CM

## 2011-01-04 DIAGNOSIS — K219 Gastro-esophageal reflux disease without esophagitis: Secondary | ICD-10-CM

## 2011-01-04 MED ORDER — POLYETHYLENE GLYCOL 3350 17 GM/SCOOP PO POWD: 17.0000 g | Freq: Every day | ORAL | Status: AC

## 2011-01-04 NOTE — Progress Notes (Signed)
Referring Provider: Ernestine Conrad, MD Primary Care Physician:  Ernestine Conrad, MD Primary Gastroenterologist: Dr. Darrick Penna   Chief Complaint  Patient presents with  . Follow-up  . Constipation    HPI:   Mr. Kirchman returns today in follow-up for IBS, GERD, abdominal pain, and dysphagia. Diarrhea now resolved and actually dealing with constipation. He is eating high fiber diet, not tried Miralax. Has decreased Bentyl from 2 QID to 2 BID. States doesn't eat much because gets very nauseated. In morning takes pills, gets nauseated, nauseated all day long until supper so he only eats supper.   Reports mid-abdominal pain with constipation. No RUQ pain. +dysphagia. Complains of reflux, all day. If bends over just a little bit, everything comes into throat and burns. On Protonix BID, still with continued symptoms.   US abdomen Sept 2012 for abdominal pain: negative Labs to include Lipase, TSH, CBC, HFP normal  Weights: July 2011 184 May 2012 204 August 205 Sept 200 Dec 188  Past Medical History  Diagnosis Date  . IBS (irritable bowel syndrome) 2009 diarrhea predominant  . Hemorrhoids, internal, with bleeding 2011  . Fatty liver 2011 184 lbs  . HA (headache)   . Chronic pain NECK  . Sleep apnea     CPAP  . Genital herpes   . GERD (gastroesophageal reflux disease)   . Anxiety   . Depression   . PTSD (post-traumatic stress disorder) FROM CHILDHOOD  . Pneumothorax on left   . N&V (nausea and vomiting)   . Chronic diarrhea     Past Surgical History  Procedure Date  . Implantation / placement epidural neurostimulator electrodes 1995  . Back surgery 2004, 2005  . Upper gastrointestinal endoscopy AUG 2011-diarrhea, dysphagia    NSAID GASTRITIS, NL DUODENUM, ESO DIL  16 MM  . Esophagogastroduodenoscopy 06/2010    distal esophageal web and ring dilated, patchy antral erythema bx showed mild chronic gastritis and no H.Pylori, distal esophageal bx was negative  . Colonoscopy 06/2010   frequent diverticula transverse colon to sigmoid colon, small internal hemorrhoid, slightly tortuous, random bx were negative. Next TCS 06/2020  . Neck surgery   . Tonsillectomy   . 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 Left lung.  . Wisdom tooth extraction     Current Outpatient Prescriptions  Medication Sig Dispense Refill  . clonazePAM (KLONOPIN) 1 MG tablet Take 1 mg by mouth 3 (three) times daily as needed. 1/2 tablet tid       . dicyclomine (BENTYL) 10 MG capsule 1-2 PO 30 MINUTES PRIOR TO MEALS AND AT BEDTIME. No more than 8 pills a day.  90 capsule  11  . divalproex (DEPAKOTE) 500 MG EC tablet Take 500 mg by mouth 2 (two) times daily.        . meloxicam (MOBIC) 7.5 MG tablet Take 7.5 mg by mouth 2 (two) times daily.       . pantoprazole (PROTONIX) 40 MG tablet Take 40 mg by mouth 2 (two) times daily.        . propranolol (INDERAL) 60 MG tablet Take 60 mg by mouth 3 (three) times daily.        . traMADol (ULTRAM) 50 MG tablet Take 50 mg by mouth every 6 (six) hours as needed.        . valACYclovir (VALTREX) 1000 MG tablet Take 1,000 mg by mouth 2 (two) times daily.        Marland Kitchen venlafaxine (EFFEXOR) 100 MG tablet Take 100 mg  by mouth 2 (two) times daily.        . polyethylene glycol powder (GLYCOLAX/MIRALAX) powder Take 17 g by mouth daily. As needed for bowel movement. Hold if diarrhea.  255 g  3  . Probiotic Product (ALIGN) 4 MG CAPS Take 4 mg by mouth daily.  30 capsule  0    Allergies as of 01/04/2011  . (No Known Allergies)    Family History  Problem Relation Age of Onset  . Colon cancer Cousin     MATERNAL, 30s  . Throat cancer Maternal Grandfather     History   Social History  . Marital Status: Married    Spouse Name: N/A    Number of Children: N/A  . Years of Education: N/A   Social History Main Topics  . Smoking status: Current Everyday Smoker -- 1.0 packs/day    Types: Cigarettes  . Smokeless tobacco: None  . Alcohol Use: No  . Drug  Use: No  . Sexually Active: None   Other Topics Concern  . None   Social History Narrative  . None    Review of Systems: Gen: + wt loss CV: Denies chest pain, palpitations, syncope, peripheral edema, and claudication. Resp: Denies dyspnea at rest, cough, wheezing, coughing up blood, and pleurisy. GI: SEE HPI Derm: Denies rash, itching, dry skin Psych: Denies depression, anxiety, memory loss, confusion. No homicidal or suicidal ideation.  Heme: Denies bruising, bleeding, and enlarged lymph nodes.  Physical Exam: BP 127/80  Pulse 50  Temp(Src) 97.8 F (36.6 C) (Temporal)  Ht 6\' 4"  (1.93 m)  Wt 188 lb 12.8 oz (85.639 kg)  BMI 22.98 kg/m2 General:   Alert and oriented. No distress noted. Pleasant and cooperative.  Head:  Normocephalic and atraumatic. Eyes:  Conjuctiva clear without scleral icterus. Mouth:  Oral mucosa pink and moist. No lesions. Neck:  Supple, without mass or thyromegaly. Heart:  S1, S2 present without murmurs, rubs, or gallops. Regular rate and rhythm. Abdomen:  +BS, soft, mild tenderness lower abdomen and non-distended. No rebound or guarding. No HSM or masses noted. Msk:  Symmetrical without gross deformities. Normal posture. Extremities:  Without edema. Neurologic:  Alert and  oriented x4;  grossly normal neurologically. Skin:  Intact without significant lesions or rashes. Cervical Nodes:  No significant cervical adenopathy. Psych:  Alert and cooperative. Normal mood and affect.

## 2011-01-04 NOTE — Patient Instructions (Signed)
Decrease Bentyl to 1 in the morning and 1 in the evening. Start taking Miralax 1 capful as needed daily. HOLD if diarrhea.   We have set you up for an endoscopy with Dr. Darrick Penna to assess your continued reflux and difficulty swallowing.  Have a [place] Christmas!!

## 2011-01-05 NOTE — Assessment & Plan Note (Addendum)
[place] negative. RUQ pain resolved. Continued nausea. Eating 1 meal per day, likely source of wt loss. Doubt dealing with biliary dyskinesia at this point. If pain returns, consider HIDA. For nausea, consider GES after EGD with Bravo study.

## 2011-01-05 NOTE — Assessment & Plan Note (Signed)
Continued GERD despite PPI BID. As per last note by Dr. Darrick Penna, needs EGD with Bravo on PPI therapy. Question if continued dysphagia, wt loss, nausea secondary to GERD.   Will be done with Propofol in OR secondary to polypharmacy.   Proceed with upper endoscopy, bravo placement in the near future with Dr. Darrick Penna. The risks, benefits, and alternatives have been discussed in detail with patient. They have stated understanding and desire to proceed.

## 2011-01-05 NOTE — Assessment & Plan Note (Signed)
Diarrhea resolved, now with constipation. Decrease Bentyl to 1 BID instead of 2 BID (pt has already decreased from 2 QID). Institute Miralax prn constipation.

## 2011-01-05 NOTE — Assessment & Plan Note (Signed)
July 2011 was 184, highest wt 205 in August 2012. Down to 188 currently, with 12 lbs of this from Sept to December. Eating one meal per day. CBC, LFTs, Lipase normal. Proceed with EGD/bravo. Question underlying gastroparesis as source of nausea therefore decreasing po intake. Consider GES. No CT scan abd/pelvis on file, may consider if continued wt loss.

## 2011-01-06 NOTE — Progress Notes (Signed)
Cc to PCP 

## 2011-01-16 ENCOUNTER — Encounter: Payer: Medicare Other | Attending: Physical Medicine & Rehabilitation

## 2011-01-16 ENCOUNTER — Ambulatory Visit: Payer: Medicare Other | Admitting: Physical Medicine & Rehabilitation

## 2011-01-16 DIAGNOSIS — M545 Low back pain, unspecified: Secondary | ICD-10-CM | POA: Insufficient documentation

## 2011-01-16 DIAGNOSIS — M79609 Pain in unspecified limb: Secondary | ICD-10-CM

## 2011-01-16 DIAGNOSIS — Z981 Arthrodesis status: Secondary | ICD-10-CM | POA: Insufficient documentation

## 2011-01-16 DIAGNOSIS — G8928 Other chronic postprocedural pain: Secondary | ICD-10-CM | POA: Insufficient documentation

## 2011-01-16 DIAGNOSIS — G8929 Other chronic pain: Secondary | ICD-10-CM | POA: Insufficient documentation

## 2011-01-16 DIAGNOSIS — F17200 Nicotine dependence, unspecified, uncomplicated: Secondary | ICD-10-CM | POA: Insufficient documentation

## 2011-01-18 ENCOUNTER — Encounter (HOSPITAL_COMMUNITY): Payer: Self-pay

## 2011-01-24 ENCOUNTER — Other Ambulatory Visit: Payer: Self-pay

## 2011-01-24 ENCOUNTER — Encounter: Payer: Self-pay | Admitting: Gastroenterology

## 2011-01-24 ENCOUNTER — Encounter (HOSPITAL_COMMUNITY): Payer: Self-pay

## 2011-01-24 ENCOUNTER — Encounter (HOSPITAL_COMMUNITY)
Admission: RE | Admit: 2011-01-24 | Discharge: 2011-01-24 | Disposition: A | Discharge status: Home / Self Care | Payer: Medicare Other | Source: Ambulatory Visit | Attending: Gastroenterology | Admitting: Gastroenterology

## 2011-01-24 HISTORY — DX: Tremor, unspecified: R25.1

## 2011-01-24 LAB — BASIC METABOLIC PANEL
Calcium: 9.6 mg/dL (ref 8.4–10.5)
GFR calc non Af Amer: >90 mL/min (ref >90)
Sodium: 140 mEq/L (ref 135–145)

## 2011-01-24 LAB — HEMOGLOBIN AND HEMATOCRIT, BLOOD: HCT: 38.4 % — ABNORMAL LOW (ref 39.0–52.0)

## 2011-01-24 NOTE — Telephone Encounter (Signed)
ERROR

## 2011-01-24 NOTE — Patient Instructions (Addendum)
20 Norman Zamora  01/24/2011   Your procedure is scheduled on:  01/30/2011  Report to [facility] at   615 AM.  Call this number if you have problems the morning of surgery: [phone]   Remember:   Do not eat food:After Midnight.  May have clear liquids:until Midnight .  Clear liquids include soda, tea, black coffee, apple or grape juice, broth.  Take these medicines the morning of surgery with A SIP OF WATER: klonopin,depakote,gabapentin,protonix,propranolol,tramdol,effexor   Do not wear jewelry, make-up or nail polish.  Do not wear lotions, powders, or perfumes. You may wear deodorant.  Do not shave 48 hours prior to surgery.  Do not bring valuables to the hospital.  Contacts, dentures or bridgework may not be worn into surgery.  Leave suitcase in the car. After surgery it may be brought to your room.  For patients admitted to the hospital, checkout time is 11:00 AM the day of discharge.   Patients discharged the day of surgery will not be allowed to drive home.  Name and phone number of your driver: family  Special Instructions: N/A   Please read over the following fact sheets that you were given: Pain Booklet, Surgical Site Infection Prevention, Anesthesia Post-op Instructions and Care and Recovery After Surgery Esophagogastroduodenoscopy This is an endoscopic procedure (a procedure that uses a device like a flexible telescope) that allows your caregiver to view the upper stomach and small bowel. This test allows your caregiver to look at the esophagus. The esophagus carries food from your mouth to your stomach. They can also look at your duodenum. This is the first part of the small intestine that attaches to the stomach. This test is used to detect problems in the bowel such as ulcers and inflammation. PREPARATION FOR TEST Nothing to eat after midnight the day before the test. NORMAL FINDINGS Normal esophagus, stomach, and duodenum. Ranges for normal findings may vary among  different laboratories and hospitals. You should always check with your doctor after having lab work or other tests done to discuss the meaning of your test results and whether your values are considered within normal limits. MEANING OF TEST  Your caregiver will go over the test results with you and discuss the importance and meaning of your results, as well as treatment options and the need for additional tests if necessary. OBTAINING THE TEST RESULTS It is your responsibility to obtain your test results. Ask the lab or department performing the test when and how you will get your results. Document Released: 05/05/2004 Document Revised: 09/14/2010 Document Reviewed: 12/13/2007 [facility] Patient Information 2012 [place], [place].PATIENT INSTRUCTIONS POST-ANESTHESIA  IMMEDIATELY FOLLOWING SURGERY:  Do not drive or operate machinery for the first twenty four hours after surgery.  Do not make any important decisions for twenty four hours after surgery or while taking narcotic pain medications or sedatives.  If you develop intractable nausea and vomiting or a severe headache please notify your doctor immediately.  FOLLOW-UP:  Please make an appointment with your surgeon as instructed. You do not need to follow up with anesthesia unless specifically instructed to do so.  WOUND CARE INSTRUCTIONS (if applicable):  Keep a dry clean dressing on the anesthesia/puncture wound site if there is drainage.  Once the wound has quit draining you may leave it open to air.  Generally you should leave the bandage intact for twenty four hours unless there is drainage.  If the epidural site drains for more than 36-48 hours please call the anesthesia department.  QUESTIONS?:  Please feel free to call your physician or the hospital operator if you have any questions, and they will be happy to assist you.     [facility] Anesthesia Department [address] [place] [phone]

## 2011-01-24 NOTE — Progress Notes (Signed)
REVIEWED.  

## 2011-01-25 ENCOUNTER — Encounter: Payer: Self-pay | Admitting: Gastroenterology

## 2011-01-25 NOTE — Progress Notes (Signed)
Pt is aware of OV on 2/6 at 1030 with AS and appt card was mailed °

## 2011-01-25 NOTE — Progress Notes (Signed)
Pt is aware of OV on 2/6 at 1030 with AS and appt card was mailed

## 2011-01-26 NOTE — H&P (Signed)
Reason for Visit     Follow-up    Constipation        Vitals - Last Recorded       BP Pulse Temp(Src) Ht Wt BMI    127/80  50  97.8 F (36.6 C) (Temporal)  6\' 4"  (1.93 m)  188 lb 12.8 oz (85.639 kg)  22.98 kg/m2       Progress Notes     Gerrit Halls, NP  01/05/2011  9:06 PM  Signed   Referring Provider: Ernestine Conrad, MD Primary Care Physician:  Ernestine Conrad, MD Primary Gastroenterologist: Dr. Darrick Penna     Chief Complaint   Patient presents with   .  Follow-up   .  Constipation      HPI:    Mr. Bhardwaj returns today in follow-up for IBS, GERD, abdominal pain, and dysphagia. Diarrhea now resolved and actually dealing with constipation. He is eating high fiber diet, not tried Miralax. Has decreased Bentyl from 2 QID to 2 BID. States doesn't eat much because gets very nauseated. In morning takes pills, gets nauseated, nauseated all day long until supper so he only eats supper.    Reports mid-abdominal pain with constipation. No RUQ pain. +dysphagia. Complains of reflux, all day. If bends over just a little bit, everything comes into throat and burns. On Protonix BID, still with continued symptoms.    US abdomen Sept 2012 for abdominal pain: negative Labs to include Lipase, TSH, CBC, HFP normal   Weights: July 2011 184 May 2012 204 August 205 Sept 200 Dec 188    Past Medical History   Diagnosis  Date   .  IBS (irritable bowel syndrome)  2009 diarrhea predominant   .  Hemorrhoids, internal, with bleeding  2011   .  Fatty liver  2011 184 lbs   .  HA (headache)     .  Chronic pain  NECK   .  Sleep apnea         CPAP   .  Genital herpes     .  GERD (gastroesophageal reflux disease)     .  Anxiety     .  Depression     .  PTSD (post-traumatic stress disorder)  FROM CHILDHOOD   .  Pneumothorax on left     .  N&V (nausea and vomiting)     .  Chronic diarrhea         Past Surgical History   Procedure  Date   .  Implantation / placement epidural neurostimulator  electrodes  1995   .  Back surgery  2004, 2005   .  Upper gastrointestinal endoscopy  AUG 2011-diarrhea, dysphagia       NSAID GASTRITIS, NL DUODENUM, ESO DIL  16 MM   .  Esophagogastroduodenoscopy  06/2010       distal esophageal web and ring dilated, patchy antral erythema bx showed mild chronic gastritis and no H.Pylori, distal esophageal bx was negative   .  Colonoscopy  06/2010       frequent diverticula transverse colon to sigmoid colon, small internal hemorrhoid, slightly tortuous, random bx were negative. Next TCS 06/2020   .  Neck surgery     .  Tonsillectomy     .  Lung removal, partial  1995       blebs, multiple lung collapse. Left lung.   .  Wisdom tooth extraction         Current Outpatient Prescriptions   Medication  Sig  Dispense  Refill   .  clonazePAM (KLONOPIN) 1 MG tablet  Take 1 mg by mouth 3 (three) times daily as needed. 1/2 tablet tid          .  dicyclomine (BENTYL) 10 MG capsule  1-2 PO 30 MINUTES PRIOR TO MEALS AND AT BEDTIME. No more than 8 pills a day.   90 capsule   11   .  divalproex (DEPAKOTE) 500 MG EC tablet  Take 500 mg by mouth 2 (two) times daily.           .  meloxicam (MOBIC) 7.5 MG tablet  Take 7.5 mg by mouth 2 (two) times daily.          .  pantoprazole (PROTONIX) 40 MG tablet  Take 40 mg by mouth 2 (two) times daily.           .  propranolol (INDERAL) 60 MG tablet  Take 60 mg by mouth 3 (three) times daily.           .  traMADol (ULTRAM) 50 MG tablet  Take 50 mg by mouth every 6 (six) hours as needed.           .  valACYclovir (VALTREX) 1000 MG tablet  Take 1,000 mg by mouth 2 (two) times daily.           Marland Kitchen  venlafaxine (EFFEXOR) 100 MG tablet  Take 100 mg by mouth 2 (two) times daily.           .  polyethylene glycol powder (GLYCOLAX/MIRALAX) powder  Take 17 g by mouth daily. As needed for bowel movement. Hold if diarrhea.   255 g   3   .  Probiotic Product (ALIGN) 4 MG CAPS  Take 4 mg by mouth daily.   30 capsule   0       Allergies as of  01/04/2011   .  (No Known Allergies)       Family History   Problem  Relation  Age of Onset   .  Colon cancer  Cousin         MATERNAL, 30s   .  Throat cancer  Maternal Grandfather         History       Social History   .  Marital Status:  Married       Spouse Name:  N/A       Number of Children:  N/A   .  Years of Education:  N/A       Social History Main Topics   .  Smoking status:  Current Everyday Smoker -- 1.0 packs/day       Types:  Cigarettes   .  Smokeless tobacco:  None   .  Alcohol Use:  No   .  Drug Use:  No   .  Sexually Active:  None       Other Topics  Concern   .  None       Social History Narrative   .  None      Review of Systems: Gen: + wt loss CV: Denies chest pain, palpitations, syncope, peripheral edema, and claudication. Resp: Denies dyspnea at rest, cough, wheezing, coughing up blood, and pleurisy. GI: SEE HPI Derm: Denies rash, itching, dry skin Psych: Denies depression, anxiety, memory loss, confusion. No homicidal or suicidal ideation.   Heme: Denies bruising, bleeding, and enlarged lymph nodes.   Physical Exam: BP 127/80  Pulse 50  Temp(Src) 97.8 F (36.6 C) (Temporal)  Ht 6\' 4"  (1.93 m)  Wt 188 lb 12.8 oz (85.639 kg)  BMI 22.98 kg/m2 General:   Alert and oriented. No distress noted. Pleasant and cooperative.   Head:  Normocephalic and atraumatic. Eyes:  Conjuctiva clear without scleral icterus. Mouth:  Oral mucosa pink and moist. No lesions. Neck:  Supple, without mass or thyromegaly. Heart:  S1, S2 present without murmurs, rubs, or gallops. Regular rate and rhythm. Abdomen:  +BS, soft, mild tenderness lower abdomen and non-distended. No rebound or guarding. No HSM or masses noted. Msk:  Symmetrical without gross deformities. Normal posture. Extremities:  Without edema. Neurologic:  Alert and  oriented x4;  grossly normal neurologically. Skin:  Intact without significant lesions or rashes. Cervical Nodes:  No significant  cervical adenopathy. Psych:  Alert and cooperative. Normal mood and affect.       Glendora Score  01/06/2011  7:43 AM  Signed Cc to PCP  Jonette Eva, MD  01/24/2011  3:21 PM  Signed REVIEWED.     Ferne Reus  01/25/2011  1:23 PM  Signed Pt is aware of OV on 2/6 at 1030 with AS and appt card was mailed  Ferne Reus  01/25/2011  1:37 PM  Signed Pt is aware of OV on 2/6 at 1030 with AS and appt card was mailed     GERD - Gerrit Halls, NP  01/05/2011  9:02 PM  Signed Continued GERD despite PPI BID. As per last note by Dr. Darrick Penna, needs EGD with Bravo on PPI therapy. Question if continued dysphagia, wt loss, nausea secondary to GERD.    Will be done with Propofol in OR secondary to polypharmacy.    Proceed with upper endoscopy, bravo placement in the near future with Dr. Darrick Penna. The risks, benefits, and alternatives have been discussed in detail with patient. They have stated understanding and desire to proceed.      IBS - Gerrit Halls, NP  01/05/2011  9:02 PM  Signed Diarrhea resolved, now with constipation. Decrease Bentyl to 1 BID instead of 2 BID (pt has already decreased from 2 QID). Institute Miralax prn constipation.    RUQ pain - Gerrit Halls, NP  01/05/2011  9:04 PM  Addendum [place] negative. RUQ pain resolved. Continued nausea. Eating 1 meal per day, likely source of wt loss. Doubt dealing with biliary dyskinesia at this point. If pain returns, consider HIDA. For nausea, consider GES after EGD with Bravo study.    Previous Version  Weight loss, abnormal Gerrit Halls, NP  01/05/2011  9:06 PM  Signed July 2011 was 184, highest wt 205 in August 2012. Down to 188 currently, with 12 lbs of this from Sept to December. Eating one meal per day. CBC, LFTs, Lipase normal. Proceed with EGD/bravo. Question underlying gastroparesis as source of nausea therefore decreasing po intake. Consider GES. No CT scan abd/pelvis on file, may consider if continued wt loss.

## 2011-01-30 ENCOUNTER — Encounter (HOSPITAL_COMMUNITY)
Admission: RE | Disposition: A | Discharge status: Home / Self Care | Payer: Self-pay | Source: Ambulatory Visit | Attending: Gastroenterology

## 2011-01-30 ENCOUNTER — Ambulatory Visit (HOSPITAL_COMMUNITY)
Admission: RE | Admit: 2011-01-30 | Discharge: 2011-01-30 | Disposition: A | Discharge status: Home / Self Care | Payer: Medicare Other | Source: Ambulatory Visit | Attending: Gastroenterology | Admitting: Gastroenterology

## 2011-01-30 ENCOUNTER — Encounter (HOSPITAL_COMMUNITY): Payer: Self-pay | Admitting: Anesthesiology

## 2011-01-30 ENCOUNTER — Encounter (HOSPITAL_COMMUNITY): Payer: Self-pay | Admitting: *Deleted

## 2011-01-30 ENCOUNTER — Other Ambulatory Visit: Payer: Self-pay | Admitting: Gastroenterology

## 2011-01-30 ENCOUNTER — Ambulatory Visit (HOSPITAL_COMMUNITY): Payer: Medicare Other | Admitting: Anesthesiology

## 2011-01-30 DIAGNOSIS — Z79899 Other long term (current) drug therapy: Secondary | ICD-10-CM | POA: Insufficient documentation

## 2011-01-30 DIAGNOSIS — R109 Unspecified abdominal pain: Secondary | ICD-10-CM | POA: Insufficient documentation

## 2011-01-30 DIAGNOSIS — Z01810 Encounter for preprocedural cardiovascular examination: Secondary | ICD-10-CM | POA: Insufficient documentation

## 2011-01-30 DIAGNOSIS — K2990 Gastroduodenitis, unspecified, without bleeding: Secondary | ICD-10-CM

## 2011-01-30 DIAGNOSIS — R110 Nausea: Secondary | ICD-10-CM | POA: Insufficient documentation

## 2011-01-30 DIAGNOSIS — R1310 Dysphagia, unspecified: Secondary | ICD-10-CM

## 2011-01-30 DIAGNOSIS — K219 Gastro-esophageal reflux disease without esophagitis: Secondary | ICD-10-CM

## 2011-01-30 DIAGNOSIS — R634 Abnormal weight loss: Secondary | ICD-10-CM | POA: Insufficient documentation

## 2011-01-30 DIAGNOSIS — Z01812 Encounter for preprocedural laboratory examination: Secondary | ICD-10-CM | POA: Insufficient documentation

## 2011-01-30 DIAGNOSIS — K2970 Gastritis, unspecified, without bleeding: Secondary | ICD-10-CM

## 2011-01-30 DIAGNOSIS — K2940 Chronic atrophic gastritis without bleeding: Secondary | ICD-10-CM | POA: Insufficient documentation

## 2011-01-30 HISTORY — PX: BIOPSY: SHX5522

## 2011-01-30 HISTORY — PX: BRAVO PH STUDY: SHX5421

## 2011-01-30 SURGERY — PH MONITORING, ESOPHAGUS, WIRELESS
Anesthesia: Monitor Anesthesia Care | Site: Esophagus

## 2011-01-30 MED ORDER — GLYCOPYRROLATE 0.2 MG/ML IJ SOLN
0.2000 mg | Freq: Once | INTRAMUSCULAR | Status: AC
  Administered 2011-01-30: 0.2 mg via INTRAVENOUS

## 2011-01-30 MED ORDER — LACTATED RINGERS IV SOLN: INTRAVENOUS | Status: DC

## 2011-01-30 MED ORDER — STERILE WATER FOR IRRIGATION IR SOLN
Status: DC | PRN
  Administered 2011-01-30: 08:00:00

## 2011-01-30 MED ORDER — ONDANSETRON HCL 4 MG/2ML IJ SOLN
INTRAMUSCULAR | Status: AC
  Administered 2011-01-30: 4 mg via INTRAVENOUS
  Filled 2011-01-30: qty 2

## 2011-01-30 MED ORDER — PROPOFOL 10 MG/ML IV EMUL
INTRAVENOUS | Status: AC
  Filled 2011-01-30: qty 20

## 2011-01-30 MED ORDER — MIDAZOLAM HCL 2 MG/2ML IJ SOLN
INTRAMUSCULAR | Status: AC
  Filled 2011-01-30: qty 2

## 2011-01-30 MED ORDER — MINERAL OIL PO OIL
TOPICAL_OIL | ORAL | Status: AC
  Filled 2011-01-30: qty 30

## 2011-01-30 MED ORDER — PROPOFOL 10 MG/ML IV EMUL
INTRAVENOUS | Status: DC | PRN
  Administered 2011-01-30: 100 ug/kg/min via INTRAVENOUS

## 2011-01-30 MED ORDER — MIDAZOLAM HCL 5 MG/5ML IJ SOLN
INTRAMUSCULAR | Status: DC | PRN
  Administered 2011-01-30: 2 mg via INTRAVENOUS

## 2011-01-30 MED ORDER — ONDANSETRON HCL 4 MG/2ML IJ SOLN
4.0000 mg | Freq: Once | INTRAMUSCULAR | Status: AC
  Administered 2011-01-30: 4 mg via INTRAVENOUS

## 2011-01-30 MED ORDER — PROPOFOL 10 MG/ML IV BOLUS
INTRAVENOUS | Status: DC | PRN
  Administered 2011-01-30: 30 mg via INTRAVENOUS

## 2011-01-30 MED ORDER — MIDAZOLAM HCL 2 MG/2ML IJ SOLN
1.0000 mg | INTRAMUSCULAR | Status: DC | PRN
  Administered 2011-01-30: 2 mg via INTRAVENOUS

## 2011-01-30 MED ORDER — ONDANSETRON HCL 4 MG/2ML IJ SOLN: 4.0000 mg | Freq: Once | INTRAMUSCULAR | Status: DC | PRN

## 2011-01-30 MED ORDER — FENTANYL CITRATE 0.05 MG/ML IJ SOLN: 25.0000 ug | INTRAMUSCULAR | Status: DC | PRN

## 2011-01-30 MED ORDER — LIDOCAINE HCL (PF) 1 % IJ SOLN
INTRAMUSCULAR | Status: AC
  Filled 2011-01-30: qty 5

## 2011-01-30 MED ORDER — LACTATED RINGERS IV SOLN
INTRAVENOUS | Status: DC
  Administered 2011-01-30: 1000 mL via INTRAVENOUS

## 2011-01-30 MED ORDER — BUTAMBEN-TETRACAINE-BENZOCAINE 2-2-14 % EX AERO
1.0000 | INHALATION_SPRAY | Freq: Once | CUTANEOUS | Status: DC
  Filled 2011-01-30: qty 56

## 2011-01-30 MED ORDER — GLYCOPYRROLATE 0.2 MG/ML IJ SOLN
INTRAMUSCULAR | Status: AC
  Filled 2011-01-30: qty 1

## 2011-01-30 SURGICAL SUPPLY — 16 items
BLOCK BITE 60FR ADLT L/F BLUE (MISCELLANEOUS) ×3 IMPLANT
ELECT REM PT RETURN 9FT ADLT (ELECTROSURGICAL)
ELECTRODE REM PT RTRN 9FT ADLT (ELECTROSURGICAL) IMPLANT
FLOOR PAD 36X40 (MISCELLANEOUS) ×3
FORCEP RJ3 GP 1.8X160 W-NEEDLE (CUTTING FORCEPS) IMPLANT
FORCEPS BIOP RAD 4 LRG CAP 4 (CUTTING FORCEPS) ×3 IMPLANT
NEEDLE SCLEROTHERAPY 25GX240 (NEEDLE) IMPLANT
PAD FLOOR 36X40 (MISCELLANEOUS) ×2 IMPLANT
PROBE APC STR FIRE (PROBE) IMPLANT
PROBE INJECTION GOLD (MISCELLANEOUS)
PROBE INJECTION GOLD 7FR (MISCELLANEOUS) IMPLANT
SNARE SHORT THROW 13M SML OVAL (MISCELLANEOUS) IMPLANT
SYR 50ML LL SCALE MARK (SYRINGE) ×3 IMPLANT
TUBING ENDO SMARTCAP PENTAX (MISCELLANEOUS) ×3 IMPLANT
TUBING IRRIGATION ENDOGATOR (MISCELLANEOUS) ×3 IMPLANT
WATER STERILE IRR 1000ML POUR (IV SOLUTION) ×3 IMPLANT

## 2011-01-30 NOTE — Anesthesia Postprocedure Evaluation (Signed)
  Anesthesia Post-op Note  Patient: Norman Zamora  Procedure(s) Performed:  BRAVO PH STUDY - Lot# [id number] Exp: 07/17/2011; ESOPHAGOGASTRODUODENOSCOPY (EGD) WITH ESOPHAGEAL DILATION - #16 savory dilator  Patient Location: PACU  Anesthesia Type: MAC  Level of Consciousness: awake, alert  and oriented  Airway and Oxygen Therapy: Patient Spontanous Breathing and Patient connected to face mask oxygen  Post-op Pain: none  Post-op Assessment: Post-op Vital signs reviewed, Patient's Cardiovascular Status Stable, Respiratory Function Stable and No signs of Nausea or vomiting  Post-op Vital Signs: Reviewed and stable  124/49, 65, 15, 100% sat. Complications: No apparent anesthesia complications

## 2011-01-30 NOTE — Transfer of Care (Signed)
Immediate Anesthesia Transfer of Care Note  Patient: Norman Zamora  Procedure(s) Performed:  BRAVO PH STUDY - Lot# [id number] Exp: 07/17/2011; ESOPHAGOGASTRODUODENOSCOPY (EGD) WITH ESOPHAGEAL DILATION - #16 savory dilator  Patient Location: PACU  Anesthesia Type: MAC  Level of Consciousness: awake, alert  and oriented  Airway & Oxygen Therapy: Patient Spontanous Breathing and Patient connected to face mask oxygen  Post-op Assessment: Report given to PACU RN  Post vital signs: Reviewed and stable Filed Vitals:   01/30/11 0745  BP: 134/82  Pulse:   Temp:   Resp: 21    Complications: No apparent anesthesia complications

## 2011-01-30 NOTE — Interval H&P Note (Signed)
History and Physical Interval Note:  01/30/2011 7:43 AM  Norman Zamora  has presented today for surgery, with the diagnosis of DYSPHEGIA, GERD  The various methods of treatment have been discussed with the patient and family. After consideration of risks, benefits and other options for treatment, the patient has consented to  Procedure(s): ESOPHAGOGASTRODUODENOSCOPY (EGD) WITH PROPOFOL BRAVO PH STUDY & possible esophageal dilation as a surgical intervention .  The patients' history has been reviewed, patient examined, no change in status, stable for surgery.  I have reviewed the patients' chart and labs.  Questions were answered to the patient's satisfaction.     [employer]

## 2011-01-30 NOTE — Anesthesia Preprocedure Evaluation (Signed)
Anesthesia Evaluation  Patient identified by MRN, date of birth, ID band Patient awake    Reviewed: Allergy & Precautions, H&P , NPO status , Patient's Chart, lab work & pertinent test results  History of Anesthesia Complications Negative for: history of anesthetic complications  Airway Mallampati: I  Neck ROM: Limited    Dental  (+) Teeth Intact   Pulmonary sleep apnea and Continuous Positive Airway Pressure Ventilation ,  clear to auscultation        Cardiovascular Regular Normal    Neuro/Psych  Headaches, PSYCHIATRIC DISORDERS (hx PTSD) Anxiety Depression    GI/Hepatic GERD- (IBS, Dysphagia)  Medicated and Controlled,  Endo/Other    Renal/GU      Musculoskeletal   Abdominal   Peds  Hematology   Anesthesia Other Findings   Reproductive/Obstetrics                           Anesthesia Physical Anesthesia Plan  ASA: III  Anesthesia Plan: MAC   Post-op Pain Management:    Induction: Intravenous  Airway Management Planned: Simple Face Mask  Additional Equipment:   Intra-op Plan:   Post-operative Plan:   Informed Consent: I have reviewed the patients History and Physical, chart, labs and discussed the procedure including the risks, benefits and alternatives for the proposed anesthesia with the patient or authorized representative who has indicated his/her understanding and acceptance.     Plan Discussed with:   Anesthesia Plan Comments:         Anesthesia Quick Evaluation

## 2011-01-31 ENCOUNTER — Encounter (HOSPITAL_COMMUNITY): Payer: Self-pay | Admitting: Gastroenterology

## 2011-02-01 ENCOUNTER — Telehealth: Payer: Self-pay | Admitting: Gastroenterology

## 2011-02-01 DIAGNOSIS — K219 Gastro-esophageal reflux disease without esophagitis: Secondary | ICD-10-CM

## 2011-02-01 NOTE — Telephone Encounter (Signed)
PLEASE CALL PT. THE STUDY WAS ONLY FOR 48 HOURS. HE NEEDS TO TAKE THE BOX BACK TO SHORT STAY.

## 2011-02-01 NOTE — Telephone Encounter (Signed)
Pt called- the [facility] is not working - he has changed the batteries but that did not help- what else can he do

## 2011-02-01 NOTE — Telephone Encounter (Signed)
Pt aware.

## 2011-02-02 ENCOUNTER — Telehealth: Payer: Self-pay | Admitting: Gastroenterology

## 2011-02-02 NOTE — Telephone Encounter (Signed)
Pt returned call and was informed. York Spaniel he is already scheduled for the GES.

## 2011-02-02 NOTE — Telephone Encounter (Signed)
LMOM to call.

## 2011-02-02 NOTE — Telephone Encounter (Signed)
Please call pt. His stomach Bx shows mild gastritis. Continue PROTONIX 30 minutes prior to meals TWICE DAILY. YOU NEED A GASTRIC EMPTYING STUDY TO COMPLETE THE EVALUATION FOR YOUR NAUSEA AND WEIGHT LOSS. FOLLOW UP IN 3 MOS.

## 2011-02-02 NOTE — Op Note (Signed)
[facility] [address] [place], [place]  [address]  ENDOSCOPY PROCEDURE REPORT  PATIENT:  Norman Zamora, Norman Zamora  MR#:  [id number] BIRTHDATE:  1970-12-21, 40 yrs. old  GENDER:  male  ENDOSCOPIST:  Jonette Eva, MD ASSISTANT: Referred by:  Ernestine Conrad, M.D.  PROCEDURE DATE:  01/30/2011 PROCEDURE:  EGD with dilatation over guidewire, EGD with biopsy  BRAVO CAPSULE PLACEMENT ASA CLASS: INDICATIONS:  DYSPHAGIA, 16 LB WEIGHT LOSS SINCE MAY 2012, NAUSEA, UNCONTROLLED REFLUX  PMHX: EGD/DIL AUG 2011 NL DUO Bx, EGD/DIL JUN 2012 NL ESO Bx, GASTRITIS  MEDICATIONS:   MAC sedation, administered by CRNA TOPICAL ANESTHETIC:  Cetacaine Spray  DESCRIPTION OF PROCEDURE:   After the risks benefits and alternatives of the procedure were thoroughly explained, informed consent was obtained.  The  endoscope was introduced through the mouth and advanced to the second portion of the duodenum.  The instrument was slowly withdrawn as the mucosa was carefully examined.  Prior to withdrawal of the scope, the guidwire was placed & BIOSPIES WERE OBTAINED.  The esophagus was dilated successfully.  the bravo capule was introduced & advanced to 39 cm from the TEETH. SUCTION APPLIED FOR ONE MINUTE. The  endoscope was introduced through the mouth and advanced to the CAPSULE.  The instrument was slowly withdrawn as the mucosa was carefully examined.  The patient was recovered in endoscopy and discharged home in satisfactory condition. <<PROCEDUREIMAGES>>  Moderate gastritis was found & BIOPSIED VIA COLD FORCEPS. Dilation was then performed at the total esophagus DUE TO C/O DYSPHAGIA. NO DEFINTINE RiNG APPRECIATED. NL DUODENUM. NO BARRETT'S.  1) Dilator:  Savary over guidewire  Size(s):  16 MM Resistance:  minimal  Heme:  none Appearance:  COMPLICATIONS:  None  ENDOSCOPIC IMPRESSION: 1) Moderate gastritis MOST LIKELY 2o to MOBIC 2) DYSPHAGIA MOST LIKeLY 2o TO UNCONTROLLED GERD, less likely eso  motility disorder  RECOMMENDATIONS: CONTINUE PROTONIX TWICE DAILY AWAIT BIOPSIES. FOLLOW UP IN 3 MOS. IF DYSPHAGIA CONTINUES, CONSIDER BPE. GASTRIC EMPTYING STUDY TO COMPLETE THE EVALUATION FOR YOUR NAUSEA AND WEIGHT LOSS.  REPEAT EXAM:  No  ______________________________ Jonette Eva, MD  CC:  n. eSIGNED:   Danne Vasek at 02/02/2011 04:07 PM  Celso Sickle, [phone]

## 2011-02-06 ENCOUNTER — Encounter (HOSPITAL_COMMUNITY)
Admit: 2011-02-06 | Discharge: 2011-02-06 | Disposition: A | Discharge status: Home / Self Care | Payer: Medicare Other | Source: Ambulatory Visit | Attending: Gastroenterology | Admitting: Gastroenterology

## 2011-02-06 DIAGNOSIS — R634 Abnormal weight loss: Secondary | ICD-10-CM | POA: Insufficient documentation

## 2011-02-06 DIAGNOSIS — R110 Nausea: Secondary | ICD-10-CM | POA: Insufficient documentation

## 2011-02-06 MED ORDER — TECHNETIUM TC 99M SULFUR COLLOID
2.0000 | Freq: Once | INTRAVENOUS | Status: AC | PRN
  Administered 2011-02-06: 2 via ORAL

## 2011-02-08 ENCOUNTER — Telehealth: Payer: Self-pay | Admitting: Gastroenterology

## 2011-02-08 NOTE — Telephone Encounter (Signed)
REVIEWED.  

## 2011-02-08 NOTE — Telephone Encounter (Signed)
CALLED PT to discuss results. BRAVO SHOWS UNCONTROLLED GERD WITH PROTONIX BID. GES NL. PT ABLE TO EAT ONLY ONCE A DAY DUE TO NAUSEA. Pt should benefit from REFLUX SURGERY. Continue Protonix BID. APPT FOR A NISSEN WITH DR Leticia Penna OR JENKINS ASAP. Pt instructed to drink Boost /Ensure or equivalent tid. OPV FEB 2013.

## 2011-02-08 NOTE — Telephone Encounter (Signed)
Referral and notes faxed to Dr Jenkins 

## 2011-02-08 NOTE — Telephone Encounter (Signed)
Results Cc to PCP  

## 2011-02-08 NOTE — Telephone Encounter (Signed)
Dr Lovell Sheehan' office called to confirm that he has an appointment with Dr Lovell Sheehan on 02/16/11 at 1015

## 2011-02-08 NOTE — Telephone Encounter (Signed)
Called pt - he aware of his appt with Dr Lovell Sheehan

## 2011-02-08 NOTE — Op Note (Signed)
  PROCEDURE:   BRAVO PH STUDY ON PROTONIX 40 MG BID  SURGEON:   Arlyce Harman, MD  FINDINGS:  PT HAD RECORDER FOR 48 HOURS. STUDY DURATION: 1d 22:26    FRACTION Ph TOTAL: 9.1   # OF REFLUXES: 136   LONG REFLUX > 5 MINS: 11    LONGEST REFLUX: 47 MINS   REFLUX TABLE: UPRIGHT 127 EPISODE, POSTPRANDIAL 39, SUPINE 9    SYMPTOM ASSOCIATION PROBABILITY(%): 94.9 HEARTBURN, 100 SUPINE, MEAL   83.2 REGURGITATION 99.9  DEMEESTER SCORE DAY 1:  32.5 (NL < 14.72)  DEMEESTER SCORE DAY 2:  21 (NL < 14.72)   DIAGNOSIS: UNCONTROLLED REFLUX  PLAN: 1. PROTONIX BID 2. SURGERY REFE RRALFOR A NISSESN 3. OPV IN FEB 2013

## 2011-02-13 ENCOUNTER — Telehealth: Payer: Self-pay

## 2011-02-13 NOTE — Telephone Encounter (Signed)
Pt came by to give information on mail order pharmacy that he is going to. [facility]. Fax ...   [phone] Pharmisist..[phone]  He said he is having his current prescriptions forward to the mail order do.   But he said that Dr. Darrick Penna was going to call him in something for nausea and she could send that to [facility] in [place] now, because he is having a lot of nausea and wants to pick it up soon.

## 2011-02-13 NOTE — Telephone Encounter (Signed)
Please call in Phenergan 25 mg 1 po q4-6h prn for nausea and vomiting, #[id number], [id number]. Call meds into [facility].

## 2011-02-13 NOTE — Telephone Encounter (Signed)
I called Phenergan to [facility] in [place]. ( See previous phone note dated today, 02/13/2011). LMOM at the pharmacy. Called and informed pt that it has been called in. He also said he has found out that the mail order is not as good a deal as supposed to be, so he wants to make note that he WILL STAY WITH [facility] AND DISREGARD PREVIOUS INFO ON THE MAIL ORDER.

## 2011-02-15 NOTE — Telephone Encounter (Signed)
Pt is aware of OV on 4/15 at 10 with AS

## 2011-02-16 NOTE — H&P (Signed)
  NTS SOAP Note  Vital Signs:  Vitals as of: 02/16/2011: Systolic 135: Diastolic 81: Heart Rate 70: Temp 97.61F: Height 56ft 3.5in: Weight 188Lbs 0 Ounces: OFC 0in: Respiratory Rate 0: O2 Saturation 0: Pain Level 6: BMI 23  BMI : 23.19 kg/m2  Subjective: This 41 Years 28 Months old Male presents for of  ABDOMINAL ISSUES: ,Has had intractable GERD for many years.  Recent esophageal studies reveal uncontrolled reflux disease despite medical therapy.  Referred for laparoscopic nissen fundoplication.  Has a hiatal hernia.  Has had esophageal dilatation in the past.  Review of Symptoms:  Constitutional:weight loss,fatigue Head:Atraumatic; no masses; no abnormalities.  Some headaches Eyes:unremarkable Nose/Mouth/Throat:unremarkable Cardiovascular:unremarkable Respiratory:dyspnea Gastrointestinabdominal pain,nausea,vomiting,heartburn Genitourinary:unremarkable neck, back pain Skin:unremarkable Hematolgic/Lymphatic:unremarkable Allergic/Immunologic:unremarkable   Past Medical History:Reviewed   Past Medical History  Surgical History: lung surgery for pneumothorax, neuromuscular back implant, neck fusion, HTN Medical Problems: musculoskeletal pain Psychiatric History:  Anxiety, Depression Allergies: nkda Medications: effexor, depakote, klonipin, propranolol, protonix, remeron, dicyclomine, tramadol, valtrex, gabapentin   Social History:Reviewed   Social History  Preferred Language: English ([place]) Race:  White Ethnicity: Not Hispanic / Latino Age: 41 Years 10 Months Alcohol:  No Recreational drug(s):  No   Smoking Status: Current every day smoker reviewed on 02/16/2011 Started Date: 01/16/1990 Packs per day: 1.00   Family History:Reviewed   Family History  Is there a family history [id number]    Objective Information: General:Well appearing, well nourished in no distress. Head:Atraumatic; no masses; no  abnormalities Neck:Supple without lymphadenopathy.  Heart:RRR, no murmur or gallop.  Normal S1, S2.  No S3, S4.  Lungs:CTA bilaterally, no wheezes, rhonchi, rales.  Breathing unlabored. Abdomen:Soft, NT/ND, no HSM, no masses.  Assessment:Intractable GERD  Diagnosis &amp; Procedure: DiagnosisCode: 787.1, ProcedureCode: 19147,    Plan:Scheduled for laparoscopic Nissen fundopication on 03/08/11.   Patient Education:Alternative treatments to surgery were discussed with patient (and family).Risks and benefits  of procedure were fully explained to the patient (and family) who gave informed consent. Patient/family questions were addressed.  Literature given.  Follow-up:Pending Surgery

## 2011-02-21 ENCOUNTER — Other Ambulatory Visit (HOSPITAL_COMMUNITY): Payer: Medicare Other

## 2011-02-22 ENCOUNTER — Ambulatory Visit: Payer: Medicare Other | Admitting: Gastroenterology

## 2011-02-24 ENCOUNTER — Encounter (HOSPITAL_COMMUNITY): Payer: Self-pay | Admitting: Pharmacy Technician

## 2011-03-02 ENCOUNTER — Encounter (HOSPITAL_COMMUNITY): Payer: Self-pay

## 2011-03-02 ENCOUNTER — Encounter (HOSPITAL_COMMUNITY)
Admission: RE | Admit: 2011-03-02 | Discharge: 2011-03-02 | Disposition: A | Discharge status: Home / Self Care | Payer: Medicare Other | Source: Ambulatory Visit | Attending: General Surgery | Admitting: General Surgery

## 2011-03-02 LAB — DIFFERENTIAL
Lymphocytes Relative: 43 % (ref 12–46)
Monocytes Absolute: 0.5 10*3/uL (ref 0.1–1.0)
Monocytes Relative: 6 % (ref 3–12)
Neutro Abs: 3.9 10*3/uL (ref 1.7–7.7)

## 2011-03-02 LAB — CBC
HCT: 42.9 % (ref 39.0–52.0)
Hemoglobin: 14.5 g/dL (ref 13.0–17.0)
MCHC: 33.8 g/dL (ref 30.0–36.0)
WBC: 8 10*3/uL (ref 4.0–10.5)

## 2011-03-02 LAB — BASIC METABOLIC PANEL
BUN: 7 mg/dL (ref 6–23)
CO2: 28 mEq/L (ref 19–32)
Chloride: 107 mEq/L (ref 96–112)
GFR calc Af Amer: >90 mL/min (ref >90)
Potassium: 5 mEq/L (ref 3.5–5.1)

## 2011-03-02 NOTE — Patient Instructions (Signed)
[address] [place] [facility]  03/02/2011   Your procedure is scheduled on:  Wednesday, 03/08/11  Report to [facility] [place] [facility].  Call this number if you have problems the morning of surgery: [phone]   Remember:   Do not eat food:After Midnight.  May have clear liquids:until Midnight .  Clear liquids include soda, tea, black coffee, apple or grape juice, broth.  Take these medicines the morning of surgery with A SIP OF WATER: dicyclomine, depakote, gabapentin, meloxicam, protonix, propranolol, effexor, phenergan if needed   Do not wear jewelry, make-up or nail polish.  Do not wear lotions, powders, or perfumes. You may wear deodorant.  Do not shave 48 hours prior to surgery.  Do not bring valuables to the hospital.  Contacts, dentures or bridgework may not be worn into surgery.  Leave suitcase in the car. After surgery it may be brought to your room.  For patients admitted to the hospital, checkout time is 11:00 AM the day of discharge.   Patients discharged the day of surgery will not be allowed to drive home.  Name and phone number of your driver: driver  Special Instructions: CHG Shower Use Special Wash: 1/2 bottle night before surgery and 1/2 bottle morning of surgery.   Please read over the following fact sheets that you were given: Pain Booklet, MRSA Information, Surgical Site Infection Prevention, Anesthesia Post-op Instructions and Care and Recovery After Surgery   Nissen Fundoplication Care After Please read the instructions outlined below and refer to this sheet for the next few weeks. These discharge instructions provide you with general information on caring for yourself after you leave the hospital. Your doctor may also give you specific instructions. While your treatment has been planned according to the most current medical practices available, unavoidable complications sometimes happen. If you have any problems or questions after discharge, please call your  doctor. ACTIVITY  Take frequent rest periods throughout the day.   Take frequent walks throughout the day. This will help to prevent blood clots.   Continue to do your coughing and deep breathing exercises once you get home. This will help to prevent pneumonia.   No strenuous activities such as heavy lifting, pushing or pulling until after your follow-up visit with your doctor. Do not lift anything heavier than 10 pounds.   Talk with your caregiver about when you may return to work and your exercise routine.   You may shower 2 days after surgery. Pat incisions dry. Do not rub incisions with washcloth or towel.   Do not drive while taking prescription pain medication.  NUTRITION  Continue with a liquid diet, or the diet you were directed to take, until your first follow-up visit with your surgeon.   Drink fluids (6-8 glasses a day).   Call your caregiver for persistent nausea (feeling sick to your stomach), vomiting, bloating or difficulty swallowing.  ELIMINATION It is very important not to strain during bowel movements. If constipation should occur, you may:  Take a mild laxative (such as Milk of Magnesia).   Add fruit and bran to your diet.   Drink more fluids.   Call your caregiver if constipation is not relieved.  FEVER If you feel feverish or have shaking chills, take your temperature. If it is 102 F (38.9 C) or above, call your caregiver. The fever may mean there is an infection. PAIN CONTROL  If a prescription was given for a pain reliever, please follow your caregiver's directions.   Only take over-the-counter  or prescription medicines for pain, discomfort, or fever as directed by your caregiver.   If the pain is not relieved by your medicine, becomes worse, or you have difficulty breathing, call your doctor.  INCISION  It is normal for your cuts (incisions) from surgery to have a small amount of drainage for the first 1-2 days. Once the drainage has stopped,  leave your incision(s) open to air.   Check your incision(s) and surrounding area daily for any redness, swelling, increased drainage or bleeding. If any of these are present or if the wound edges start to separate, call your doctor.   If you have small adhesive strips in place, they will peel and fall off. (If these strips are covered with a clear bandage, your doctor will tell you when to remove them.)   If you have staples, your caregiver will remove them at the follow-up appointment.  Document Released: 08/26/2003 Document Revised: 09/14/2010 Document Reviewed: 11/29/2006 [facility] Patient Information 2012 [place], [place].

## 2011-03-08 ENCOUNTER — Inpatient Hospital Stay (HOSPITAL_COMMUNITY): Payer: Medicare Other

## 2011-03-08 ENCOUNTER — Inpatient Hospital Stay (HOSPITAL_COMMUNITY): Payer: Medicare Other | Admitting: Anesthesiology

## 2011-03-08 ENCOUNTER — Encounter (HOSPITAL_COMMUNITY)
Admission: RE | Disposition: A | Discharge status: Home / Self Care | Payer: Self-pay | Source: Ambulatory Visit | Attending: General Surgery

## 2011-03-08 ENCOUNTER — Encounter (HOSPITAL_COMMUNITY): Payer: Self-pay | Admitting: Anesthesiology

## 2011-03-08 ENCOUNTER — Inpatient Hospital Stay (HOSPITAL_COMMUNITY)
Admission: RE | Admit: 2011-03-08 | Discharge: 2011-03-10 | DRG: 328 | Disposition: A | Discharge status: Home / Self Care | Payer: Medicare Other | Source: Ambulatory Visit | Attending: General Surgery | Admitting: General Surgery

## 2011-03-08 ENCOUNTER — Encounter (HOSPITAL_COMMUNITY): Payer: Self-pay | Admitting: *Deleted

## 2011-03-08 DIAGNOSIS — F411 Generalized anxiety disorder: Secondary | ICD-10-CM | POA: Diagnosis present

## 2011-03-08 DIAGNOSIS — F3289 Other specified depressive episodes: Secondary | ICD-10-CM | POA: Diagnosis present

## 2011-03-08 DIAGNOSIS — K449 Diaphragmatic hernia without obstruction or gangrene: Secondary | ICD-10-CM | POA: Diagnosis present

## 2011-03-08 DIAGNOSIS — Z981 Arthrodesis status: Secondary | ICD-10-CM

## 2011-03-08 DIAGNOSIS — G4730 Sleep apnea, unspecified: Secondary | ICD-10-CM | POA: Diagnosis present

## 2011-03-08 DIAGNOSIS — Z79899 Other long term (current) drug therapy: Secondary | ICD-10-CM

## 2011-03-08 DIAGNOSIS — I10 Essential (primary) hypertension: Secondary | ICD-10-CM | POA: Diagnosis present

## 2011-03-08 DIAGNOSIS — K219 Gastro-esophageal reflux disease without esophagitis: Principal | ICD-10-CM | POA: Diagnosis present

## 2011-03-08 DIAGNOSIS — F329 Major depressive disorder, single episode, unspecified: Secondary | ICD-10-CM | POA: Diagnosis present

## 2011-03-08 HISTORY — PX: LAPAROSCOPIC NISSEN FUNDOPLICATION: SHX1932

## 2011-03-08 LAB — BLOOD GAS, ARTERIAL
Acid-base deficit: 0.1 mmol/L (ref 0.0–2.0)
Bicarbonate: 26.4 mEq/L — ABNORMAL HIGH (ref 20.0–24.0)
O2 Saturation: 96.8 %
Patient temperature: 37
TCO2: 24 mmol/L (ref 0–100)

## 2011-03-08 SURGERY — FUNDOPLICATION, NISSEN, LAPAROSCOPIC
Anesthesia: General | Site: Abdomen | Wound class: Clean

## 2011-03-08 MED ORDER — ONDANSETRON HCL 4 MG/2ML IJ SOLN
4.0000 mg | Freq: Once | INTRAMUSCULAR | Status: AC
  Administered 2011-03-08: 4 mg via INTRAVENOUS

## 2011-03-08 MED ORDER — GLYCOPYRROLATE 0.2 MG/ML IJ SOLN
INTRAMUSCULAR | Status: AC
  Filled 2011-03-08: qty 1

## 2011-03-08 MED ORDER — CEFAZOLIN SODIUM 1-5 GM-% IV SOLN
INTRAVENOUS | Status: AC
  Filled 2011-03-08: qty 50

## 2011-03-08 MED ORDER — GLYCOPYRROLATE 0.2 MG/ML IJ SOLN
INTRAMUSCULAR | Status: DC | PRN
  Administered 2011-03-08: .4 mg via INTRAVENOUS

## 2011-03-08 MED ORDER — FENTANYL CITRATE 0.05 MG/ML IJ SOLN
INTRAMUSCULAR | Status: DC | PRN
  Administered 2011-03-08 (×2): 50 ug via INTRAVENOUS
  Administered 2011-03-08 (×3): 100 ug via INTRAVENOUS

## 2011-03-08 MED ORDER — FENTANYL CITRATE 0.05 MG/ML IJ SOLN
INTRAMUSCULAR | Status: AC
  Filled 2011-03-08: qty 5

## 2011-03-08 MED ORDER — BIOTENE DRY MOUTH MT LIQD
15.0000 mL | Freq: Four times a day (QID) | OROMUCOSAL | Status: DC
  Administered 2011-03-08 – 2011-03-10 (×6): 15 mL via OROMUCOSAL

## 2011-03-08 MED ORDER — DIVALPROEX SODIUM 250 MG PO DR TAB
500.0000 mg | DELAYED_RELEASE_TABLET | Freq: Two times a day (BID) | ORAL | Status: DC
  Administered 2011-03-08 – 2011-03-10 (×5): 500 mg via ORAL
  Filled 2011-03-08 (×5): qty 2

## 2011-03-08 MED ORDER — ONDANSETRON HCL 4 MG/2ML IJ SOLN: 4.0000 mg | Freq: Four times a day (QID) | INTRAMUSCULAR | Status: DC | PRN

## 2011-03-08 MED ORDER — SODIUM CHLORIDE 0.9 % IR SOLN
Status: DC | PRN
  Administered 2011-03-08: 1000 mL

## 2011-03-08 MED ORDER — CEFAZOLIN SODIUM 1-5 GM-% IV SOLN
INTRAVENOUS | Status: DC | PRN
  Administered 2011-03-08: 1 g via INTRAVENOUS

## 2011-03-08 MED ORDER — MIDAZOLAM HCL 2 MG/2ML IJ SOLN
1.0000 mg | INTRAMUSCULAR | Status: DC | PRN
  Administered 2011-03-08: 2 mg via INTRAVENOUS

## 2011-03-08 MED ORDER — LACTATED RINGERS IV SOLN: INTRAVENOUS | Status: DC

## 2011-03-08 MED ORDER — LIDOCAINE HCL (PF) 1 % IJ SOLN
INTRAMUSCULAR | Status: AC
  Filled 2011-03-08: qty 5

## 2011-03-08 MED ORDER — NEOSTIGMINE METHYLSULFATE 1 MG/ML IJ SOLN
INTRAMUSCULAR | Status: DC | PRN
  Administered 2011-03-08: 3 mg via INTRAVENOUS

## 2011-03-08 MED ORDER — ENOXAPARIN SODIUM 40 MG/0.4ML ~~LOC~~ SOLN
40.0000 mg | SUBCUTANEOUS | Status: DC
  Administered 2011-03-09 – 2011-03-10 (×2): 40 mg via SUBCUTANEOUS
  Filled 2011-03-08 (×2): qty 0.4

## 2011-03-08 MED ORDER — GLYCOPYRROLATE 0.2 MG/ML IJ SOLN
0.2000 mg | Freq: Once | INTRAMUSCULAR | Status: AC
  Administered 2011-03-08: 0.2 mg via INTRAVENOUS

## 2011-03-08 MED ORDER — FENTANYL CITRATE 0.05 MG/ML IJ SOLN
25.0000 ug | INTRAMUSCULAR | Status: DC | PRN
  Administered 2011-03-08: 50 ug via INTRAVENOUS

## 2011-03-08 MED ORDER — FENTANYL CITRATE 0.05 MG/ML IJ SOLN
INTRAMUSCULAR | Status: AC
  Administered 2011-03-08: 50 ug via INTRAVENOUS
  Filled 2011-03-08: qty 2

## 2011-03-08 MED ORDER — ACETAMINOPHEN 10 MG/ML IV SOLN
INTRAVENOUS | Status: AC
  Administered 2011-03-08: 1000 mg via INTRAVENOUS
  Filled 2011-03-08: qty 100

## 2011-03-08 MED ORDER — ROCURONIUM BROMIDE 50 MG/5ML IV SOLN
INTRAVENOUS | Status: AC
  Filled 2011-03-08: qty 1

## 2011-03-08 MED ORDER — MIDAZOLAM HCL 2 MG/2ML IJ SOLN
INTRAMUSCULAR | Status: AC
  Administered 2011-03-08: 2 mg via INTRAVENOUS
  Filled 2011-03-08: qty 2

## 2011-03-08 MED ORDER — PROPOFOL 10 MG/ML IV EMUL
INTRAVENOUS | Status: AC
  Filled 2011-03-08: qty 20

## 2011-03-08 MED ORDER — CEFAZOLIN SODIUM 1-5 GM-% IV SOLN: 1.0000 g | INTRAVENOUS | Status: DC

## 2011-03-08 MED ORDER — GLYCOPYRROLATE 0.2 MG/ML IJ SOLN
INTRAMUSCULAR | Status: AC
  Administered 2011-03-08: 0.2 mg via INTRAVENOUS
  Filled 2011-03-08: qty 1

## 2011-03-08 MED ORDER — MIDAZOLAM HCL 2 MG/2ML IJ SOLN
INTRAMUSCULAR | Status: AC
  Filled 2011-03-08: qty 2

## 2011-03-08 MED ORDER — HYDROMORPHONE HCL PF 1 MG/ML IJ SOLN
1.0000 mg | INTRAMUSCULAR | Status: DC | PRN
  Administered 2011-03-08 – 2011-03-10 (×10): 1 mg via INTRAVENOUS
  Filled 2011-03-08 (×9): qty 1

## 2011-03-08 MED ORDER — BUPIVACAINE HCL (PF) 0.5 % IJ SOLN
INTRAMUSCULAR | Status: AC
  Filled 2011-03-08: qty 30

## 2011-03-08 MED ORDER — LACTATED RINGERS IV SOLN
INTRAVENOUS | Status: DC
  Administered 2011-03-08: 1000 mL via INTRAVENOUS
  Administered 2011-03-08: 08:00:00 via INTRAVENOUS

## 2011-03-08 MED ORDER — HYDROMORPHONE HCL PF 1 MG/ML IJ SOLN
INTRAMUSCULAR | Status: AC
  Filled 2011-03-08: qty 1

## 2011-03-08 MED ORDER — NALOXONE HCL 0.4 MG/ML IJ SOLN
INTRAMUSCULAR | Status: AC
  Filled 2011-03-08: qty 1

## 2011-03-08 MED ORDER — ALUM & MAG HYDROXIDE-SIMETH 200-200-20 MG/5ML PO SUSP: 30.0000 mL | ORAL | Status: DC | PRN

## 2011-03-08 MED ORDER — ROCURONIUM BROMIDE 100 MG/10ML IV SOLN
INTRAVENOUS | Status: DC | PRN
Start: 2011-03-08 — End: 2011-03-08
  Administered 2011-03-08: 40 mg via INTRAVENOUS
  Administered 2011-03-08 (×2): 10 mg via INTRAVENOUS

## 2011-03-08 MED ORDER — BUPIVACAINE HCL (PF) 0.5 % IJ SOLN
INTRAMUSCULAR | Status: DC | PRN
  Administered 2011-03-08: 16 mL

## 2011-03-08 MED ORDER — ENOXAPARIN SODIUM 40 MG/0.4ML ~~LOC~~ SOLN
40.0000 mg | Freq: Once | SUBCUTANEOUS | Status: AC
  Administered 2011-03-08: 40 mg via SUBCUTANEOUS

## 2011-03-08 MED ORDER — ONDANSETRON HCL 4 MG/2ML IJ SOLN
INTRAMUSCULAR | Status: AC
  Filled 2011-03-08: qty 2

## 2011-03-08 MED ORDER — PANTOPRAZOLE SODIUM 40 MG IV SOLR
40.0000 mg | INTRAVENOUS | Status: DC
  Administered 2011-03-08: 40 mg via INTRAVENOUS
  Filled 2011-03-08: qty 40

## 2011-03-08 MED ORDER — GABAPENTIN 300 MG PO CAPS
300.0000 mg | ORAL_CAPSULE | Freq: Three times a day (TID) | ORAL | Status: DC
Start: 2011-03-08 — End: 2011-03-10
  Administered 2011-03-08 – 2011-03-10 (×6): 300 mg via ORAL
  Filled 2011-03-08 (×5): qty 1

## 2011-03-08 MED ORDER — NALOXONE HCL 0.4 MG/ML IJ SOLN: 0.4000 mg | INTRAMUSCULAR | Status: DC | PRN

## 2011-03-08 MED ORDER — SIMETHICONE 80 MG PO CHEW: 80.0000 mg | CHEWABLE_TABLET | Freq: Four times a day (QID) | ORAL | Status: DC | PRN

## 2011-03-08 MED ORDER — LACTATED RINGERS IV SOLN
INTRAVENOUS | Status: DC
  Administered 2011-03-08: 100 via INTRAVENOUS
  Administered 2011-03-09: 100 mL/h via INTRAVENOUS

## 2011-03-08 MED ORDER — MIDAZOLAM HCL 5 MG/5ML IJ SOLN
INTRAMUSCULAR | Status: DC | PRN
  Administered 2011-03-08: 2 mg via INTRAVENOUS

## 2011-03-08 MED ORDER — LIDOCAINE HCL 1 % IJ SOLN
INTRAMUSCULAR | Status: DC | PRN
  Administered 2011-03-08: 25 mg via INTRADERMAL

## 2011-03-08 MED ORDER — ENOXAPARIN SODIUM 40 MG/0.4ML ~~LOC~~ SOLN
SUBCUTANEOUS | Status: AC
  Administered 2011-03-08: 40 mg via SUBCUTANEOUS
  Filled 2011-03-08: qty 0.4

## 2011-03-08 MED ORDER — PROPOFOL 10 MG/ML IV BOLUS
INTRAVENOUS | Status: DC | PRN
Start: 2011-03-08 — End: 2011-03-08
  Administered 2011-03-08: 150 mg via INTRAVENOUS

## 2011-03-08 MED ORDER — VENLAFAXINE HCL 25 MG PO TABS
100.0000 mg | ORAL_TABLET | Freq: Two times a day (BID) | ORAL | Status: DC
  Administered 2011-03-08 – 2011-03-10 (×5): 100 mg via ORAL
  Filled 2011-03-08: qty 2
  Filled 2011-03-08 (×6): qty 4
  Filled 2011-03-08 (×2): qty 2
  Filled 2011-03-08: qty 4
  Filled 2011-03-08 (×2): qty 2

## 2011-03-08 MED ORDER — PROPRANOLOL HCL 20 MG PO TABS
60.0000 mg | ORAL_TABLET | Freq: Every day | ORAL | Status: DC
  Administered 2011-03-08 – 2011-03-10 (×3): 60 mg via ORAL
  Filled 2011-03-08 (×4): qty 3

## 2011-03-08 MED ORDER — ONDANSETRON HCL 4 MG PO TABS: 4.0000 mg | ORAL_TABLET | Freq: Four times a day (QID) | ORAL | Status: DC | PRN

## 2011-03-08 MED ORDER — ONDANSETRON HCL 4 MG/2ML IJ SOLN: 4.0000 mg | Freq: Once | INTRAMUSCULAR | Status: DC | PRN

## 2011-03-08 MED ORDER — ACETAMINOPHEN 10 MG/ML IV SOLN
1000.0000 mg | Freq: Four times a day (QID) | INTRAVENOUS | Status: AC
Start: 2011-03-08 — End: 2011-03-09
  Administered 2011-03-08 – 2011-03-09 (×4): 1000 mg via INTRAVENOUS
  Filled 2011-03-08 (×3): qty 100

## 2011-03-08 MED ORDER — NEOSTIGMINE METHYLSULFATE 1 MG/ML IJ SOLN
INTRAMUSCULAR | Status: AC
  Filled 2011-03-08: qty 10

## 2011-03-08 MED ORDER — CLONAZEPAM 0.5 MG PO TABS
0.5000 mg | ORAL_TABLET | Freq: Three times a day (TID) | ORAL | Status: DC
  Administered 2011-03-08 – 2011-03-10 (×6): 0.5 mg via ORAL
  Filled 2011-03-08 (×6): qty 1

## 2011-03-08 SURGICAL SUPPLY — 50 items
APPLIER CLIP ROT 10 11.4 M/L (STAPLE)
BAG HAMPER (MISCELLANEOUS) ×2 IMPLANT
BLADE SURG SZ11 CARB STEEL (BLADE) ×2 IMPLANT
CLIP APPLIE ROT 10 11.4 M/L (STAPLE) IMPLANT
CLOTH BEACON ORANGE TIMEOUT ST (SAFETY) ×2 IMPLANT
COVER LIGHT HANDLE STERIS (MISCELLANEOUS) ×4 IMPLANT
DECANTER SPIKE VIAL GLASS SM (MISCELLANEOUS) ×2 IMPLANT
DEVICE SUTURE ENDOST 10MM (ENDOMECHANICALS) ×2 IMPLANT
DISSECTOR BLUNT TIP ENDO 5MM (MISCELLANEOUS) ×2 IMPLANT
DRAPE PROXIMA HALF (DRAPES) ×2 IMPLANT
DURAPREP 26ML APPLICATOR (WOUND CARE) ×2 IMPLANT
ELECT REM PT RETURN 9FT ADLT (ELECTROSURGICAL) ×2
ELECTRODE REM PT RTRN 9FT ADLT (ELECTROSURGICAL) ×1 IMPLANT
FILTER SMOKE EVAC LAPAROSHD (FILTER) ×2 IMPLANT
GLOVE BIO SURGEON STRL SZ7.5 (GLOVE) ×4 IMPLANT
GOWN STRL REIN XL XLG (GOWN DISPOSABLE) ×6 IMPLANT
GRASPER ENDO BABCOCK 10 (MISCELLANEOUS) ×1 IMPLANT
GRASPER ENDO BABCOCK 10MM (MISCELLANEOUS) ×1
GRASPER ENDO ROTC 5X31 (INSTRUMENTS) ×2 IMPLANT
INST SET LAPROSCOPIC AP (KITS) ×2 IMPLANT
IV NS IRRIG 3000ML ARTHROMATIC (IV SOLUTION) ×2 IMPLANT
KIT ROOM TURNOVER AP CYSTO (KITS) ×2 IMPLANT
LIGASURE 5MM LAPAROSCOPIC (INSTRUMENTS) IMPLANT
LIGASURE LAP ATLAS 10MM 37CM (INSTRUMENTS) ×2 IMPLANT
MANIFOLD NEPTUNE II (INSTRUMENTS) ×2 IMPLANT
NEEDLE HYPO 25X1 1.5 SAFETY (NEEDLE) ×2 IMPLANT
NEEDLE INSUFFLATION 14GA 120MM (NEEDLE) ×2 IMPLANT
NS IRRIG 1000ML POUR BTL (IV SOLUTION) ×2 IMPLANT
PACK PERI GYN (CUSTOM PROCEDURE TRAY) ×2 IMPLANT
PAD ARMBOARD 7.5X6 YLW CONV (MISCELLANEOUS) ×2 IMPLANT
RETRACTOR LAPSCP 12X46 CVD (ENDOMECHANICALS) ×1 IMPLANT
RETRACTOR MAXI (MISCELLANEOUS) ×2 IMPLANT
RTRCTR LAPSCP 12X46 CVD (ENDOMECHANICALS) ×2
SET BASIN LINEN APH (SET/KITS/TRAYS/PACK) ×2 IMPLANT
SET TUBE IRRIG SUCTION NO TIP (IRRIGATION / IRRIGATOR) ×2 IMPLANT
SOLUTION ANTI FOG 6CC (MISCELLANEOUS) ×2 IMPLANT
SPONGE GAUZE 2X2 8PLY STRL LF (GAUZE/BANDAGES/DRESSINGS) ×10 IMPLANT
STAPLER VISISTAT (STAPLE) ×2 IMPLANT
SUT SURGIDAC NAB ES-9 0 48 120 (SUTURE) ×12 IMPLANT
SUT VIC AB 4-0 PS2 27 (SUTURE) IMPLANT
SUT VICRYL 0 UR6 27IN ABS (SUTURE) ×2 IMPLANT
SYR CONTROL 10ML LL (SYRINGE) ×2 IMPLANT
SYRINGE 10CC LL (SYRINGE) ×2 IMPLANT
TOWEL OR 17X26 4PK STRL BLUE (TOWEL DISPOSABLE) ×2 IMPLANT
TRAY FOLEY CATH 14FR (SET/KITS/TRAYS/PACK) ×2 IMPLANT
TROCAR Z-THRD FIOS HNDL 11X100 (TROCAR) ×2 IMPLANT
TROCAR Z-THREAD SLEEVE 11X100 (TROCAR) ×8 IMPLANT
TUBING HI FLO HEAT INSUFFLATOR (IRRIGATION / IRRIGATOR) ×2 IMPLANT
TUBING INSUFFLATION HIGH FLOW (TUBING) ×2 IMPLANT
WARMER LAPAROSCOPE (MISCELLANEOUS) ×2 IMPLANT

## 2011-03-08 NOTE — Anesthesia Preprocedure Evaluation (Signed)
Anesthesia Evaluation  Patient identified by MRN, date of birth, ID band Patient awake    Reviewed: Allergy & Precautions, H&P , NPO status , Patient's Chart, lab work & pertinent test results  History of Anesthesia Complications Negative for: history of anesthetic complications  Airway Mallampati: I  Neck ROM: Limited    Dental  (+) Teeth Intact   Pulmonary sleep apnea and Continuous Positive Airway Pressure Ventilation ,  clear to auscultation        Cardiovascular Regular Normal    Neuro/Psych  Headaches, PSYCHIATRIC DISORDERS (hx PTSD) Anxiety Depression    GI/Hepatic GERD- (IBS, Dysphagia)  Medicated and Controlled,  Endo/Other    Renal/GU      Musculoskeletal   Abdominal   Peds  Hematology   Anesthesia Other Findings   Reproductive/Obstetrics                           Anesthesia Physical Anesthesia Plan  ASA: III  Anesthesia Plan: General   Post-op Pain Management:    Induction: Intravenous, Rapid sequence and Cricoid pressure planned  Airway Management Planned: Oral ETT  Additional Equipment:   Intra-op Plan:   Post-operative Plan: Extubation in OR  Informed Consent: I have reviewed the patients History and Physical, chart, labs and discussed the procedure including the risks, benefits and alternatives for the proposed anesthesia with the patient or authorized representative who has indicated his/her understanding and acceptance.     Plan Discussed with:   Anesthesia Plan Comments:         Anesthesia Quick Evaluation

## 2011-03-08 NOTE — Progress Notes (Signed)
Dr Lovell Sheehan in around 11:00 aware of patient status. Ordered dignostic chest xray. And request be placed in a ICU bed.

## 2011-03-08 NOTE — Interval H&P Note (Signed)
History and Physical Interval Note:  03/08/2011 7:22 AM  Norman Zamora  has presented today for surgery, with the diagnosis of GERD gastroesophageal reflux disease  The various methods of treatment have been discussed with the patient and family. After consideration of risks, benefits and other options for treatment, the patient has consented to  Procedure(s) (LRB): LAPAROSCOPIC NISSEN FUNDOPLICATION (N/A) as a surgical intervention .  The patients' history has been reviewed, patient examined, no change in status, stable for surgery.  I have reviewed the patients' chart and labs.  Questions were answered to the patient's satisfaction.     [name]

## 2011-03-08 NOTE — Anesthesia Procedure Notes (Signed)
Procedure Name: Intubation Date/Time: 03/08/2011 7:43 AM Performed by: Despina Hidden Pre-anesthesia Checklist: Emergency Drugs available, Patient identified, Suction available and Patient being monitored Patient Re-evaluated:Patient Re-evaluated prior to inductionOxygen Delivery Method: Circle System Utilized Preoxygenation: Pre-oxygenation with 100% oxygen Intubation Type: IV induction and Cricoid Pressure applied Ventilation: Mask ventilation without difficulty Laryngoscope Size: 3 and Mac Grade View: Grade I Tube type: Oral Tube size: 8.0 mm Number of attempts: 1 Airway Equipment and Method: stylet Placement Confirmation: ETT inserted through vocal cords under direct vision,  positive ETCO2 and breath sounds checked- equal and bilateral Secured at: 22 cm Tube secured with: Tape Dental Injury: Teeth and Oropharynx as per pre-operative assessment

## 2011-03-08 NOTE — Progress Notes (Signed)
Patient around 10:36  Having resounding loud snoring. Could not get patient to response oropharyngeal air way to assist in breathing. Dr Marcos Eke called to pacu. Order narcan 0.1 iv give now for reversal. And blood gas ordered as well. At 10:43 patient still not responding to stimulus but 02 sats between 95-100. Patient color pink. Nail  Beds pink. By around 10:48 patient began responding to name and stimulus 02 sat at 100 % Filed Vitals:   03/08/11 1115  BP: 138/72  Pulse: 73  Temp:   Resp: 23

## 2011-03-08 NOTE — Anesthesia Postprocedure Evaluation (Signed)
  Anesthesia Post-op Note  Patient: Norman Zamora  Procedure(s) Performed: Procedure(s) (LRB): LAPAROSCOPIC NISSEN FUNDOPLICATION (N/A)  Patient Location: PACU  Anesthesia Type: General  Level of Consciousness: awake, alert , oriented and patient cooperative  Airway and Oxygen Therapy: Patient Spontanous Breathing  Post-op Pain: 3 /10, mild  Post-op Assessment: Post-op Vital signs reviewed, Patient's Cardiovascular Status Stable, Respiratory Function Stable, Patent Airway and No signs of Nausea or vomiting  Post-op Vital Signs: Reviewed and stable  Complications: No apparent anesthesia complications

## 2011-03-08 NOTE — Progress Notes (Signed)
Patient status much improved. Dr Marcos Eke aware ok to take patient to ICU.

## 2011-03-08 NOTE — Op Note (Signed)
Patient:  Norman Zamora  DOB:  [birth year]  MRN:  [id number]   Preop Diagnosis:  Gastroesophageal reflux disease  Postop Diagnosis:  Same  Procedure:  Laparoscopic Nissen fundoplication  Surgeon:  Franky Macho, M.D.  Anes:  General endotracheal  Indications:  Patient is a 41 year old white male presents with intractable gastroesophageal reflux disease. The risks and benefits of the procedure including bleeding, infection, bowel injury, the possibly of an open procedure were fully explained to the patient, gave informed consent.  Procedure note:  Patient was placed in the lithotomy position after induction of general endotracheal anesthesia. The abdomen was prepped and draped using usual sterile technique with DuraPrep. Surgical site confirmation was performed.  An incision was made midway between the subxiphoid process and the umbilicus along the midline. A Veress needle was introduced into the abdominal cavity and confirmation of placement was done using the saline drop test. The abdomen was then insufflated to 16 mm mercury pressure. An 11 mm trocar was introduced into the abdominal cavity under direct visualization without difficulty. The patient was placed in reverse Trendelenburg position and additional millimeter trochars were placed the left flank, left upper quadrant, right upper quadrant, and right flank regions. A paddle retractor was then placed to retract the liver superiorly and anteriorly. Using the LigaSure, the lesser sac was entered into and the dissection was taken up to the hiatus. The patient was noted to have a sliding hiatal hernia with no significant hernia sac in the posterior mediastinum. The short gastrics were divided using the LigaSure. The left and right crura were fully identified. They were reapproximated using 0 Ethibond interrupted sutures. The Nissen wrap was then brought behind the gastroesophageal juncture. It was secured anteriorly to the gastroesophageal  juncture using 0 Ethibond interrupted sutures. A 3 cm in length the wrap was performed. This was loose. The area was inspected and no significant bleeding or evidence of perforation was noted. All air was evacuated from the abdominal cavity prior to removal of the trochars.  All wounds were gave normal saline. All wounds were injected with 0.5% Sensorcaine. The midline trocar site fascia was reapproximated using 0 Vicryl interrupted suture. All skin incisions were closed using staples. Betadine ointment after dressings were applied.  All tape and needle counts were correct the end of the procedure. The patient was extubated in the operating room went back to recovery room awake in stable condition.  Complications:  None  EBL:  Minimal  Specimen:  None

## 2011-03-08 NOTE — Transfer of Care (Signed)
Immediate Anesthesia Transfer of Care Note  Patient: Norman Zamora  Procedure(s) Performed: Procedure(s) (LRB): LAPAROSCOPIC NISSEN FUNDOPLICATION (N/A)  Patient Location: PACU  Anesthesia Type: General  Level of Consciousness: awake and patient cooperative  Airway & Oxygen Therapy: Patient Spontanous Breathing and Patient connected to face mask oxygen  Post-op Assessment: Report given to PACU RN, Post -op Vital signs reviewed and stable and Patient moving all extremities  Post vital signs: Reviewed and stable  Complications: No apparent anesthesia complications

## 2011-03-09 LAB — PHOSPHORUS: Phosphorus: 3 mg/dL (ref 2.3–4.6)

## 2011-03-09 LAB — BASIC METABOLIC PANEL
CO2: 31 mEq/L (ref 19–32)
Chloride: 107 mEq/L (ref 96–112)
GFR calc non Af Amer: >90 mL/min (ref >90)
Glucose, Bld: 96 mg/dL (ref 70–99)
Potassium: 4 mEq/L (ref 3.5–5.1)
Sodium: 142 mEq/L (ref 135–145)

## 2011-03-09 LAB — CBC
HCT: 37.7 % — ABNORMAL LOW (ref 39.0–52.0)
Platelets: 174 10*3/uL (ref 150–400)
RBC: 3.78 MIL/uL — ABNORMAL LOW (ref 4.22–5.81)
RDW: 14.2 % (ref 11.5–15.5)
WBC: 8.4 10*3/uL (ref 4.0–10.5)

## 2011-03-09 MED ORDER — ACETAMINOPHEN 10 MG/ML IV SOLN
1000.0000 mg | Freq: Four times a day (QID) | INTRAVENOUS | Status: AC
  Administered 2011-03-09 – 2011-03-10 (×3): 1000 mg via INTRAVENOUS
  Filled 2011-03-09 (×4): qty 100

## 2011-03-09 MED ORDER — KCL IN DEXTROSE-NACL 20-5-0.45 MEQ/L-%-% IV SOLN
INTRAVENOUS | Status: DC
  Administered 2011-03-09: 50 mL via INTRAVENOUS
  Administered 2011-03-10: 08:00:00 via INTRAVENOUS

## 2011-03-09 MED ORDER — PANTOPRAZOLE SODIUM 40 MG PO TBEC
40.0000 mg | DELAYED_RELEASE_TABLET | Freq: Every day | ORAL | Status: DC
  Administered 2011-03-09: 40 mg via ORAL
  Filled 2011-03-09: qty 1

## 2011-03-09 NOTE — Anesthesia Postprocedure Evaluation (Signed)
  Anesthesia Post-op Note  Patient: Norman Zamora  Procedure(s) Performed: Procedure(s) (LRB): LAPAROSCOPIC NISSEN FUNDOPLICATION (N/A)  Patient Location: A3  Anesthesia Type: General  Level of Consciousness: awake, alert , oriented and patient cooperative  Airway and Oxygen Therapy: Patient Spontanous Breathing  Post-op Pain: moderate  Post-op Assessment: Post-op Vital signs reviewed, Patient's Cardiovascular Status Stable, Respiratory Function Stable, Patent Airway, No signs of Nausea or vomiting, Adequate PO intake and Pain level controlled  Post-op Vital Signs: Reviewed and stable  Complications: No apparent anesthesia complications

## 2011-03-09 NOTE — Addendum Note (Signed)
Addendum  created 03/09/11 0747 by Corena Pilgrim, CRNA   Modules edited:Notes Section

## 2011-03-09 NOTE — Progress Notes (Signed)
Pt put CPAP on with full face mask. Hr 50 spo2 97%

## 2011-03-09 NOTE — Progress Notes (Signed)
1 Day Post-Op  Subjective: Having moderate incisional pain. Sleep apnea is well controlled with his own CPAP machine.  Objective: Vital signs in last 24 hours: Temp:  [97.4 F (36.3 C)-97.9 F (36.6 C)] 97.7 F (36.5 C) (02/21 0400) Pulse Rate:  [48-88] 55  (02/21 0600) Resp:  [8-23] 13  (02/21 0600) BP: (106-147)/(65-91) 126/72 mmHg (02/21 0600) SpO2:  [90 %-100 %] 97 % (02/21 0600) Weight:  [85.276 kg (188 lb)] 85.276 kg (188 lb) (02/20 1200)    Intake/Output from previous day: 02/20 0701 - 02/21 0700 In: 4200 [P.O.:480; I.V.:3520; IV Piggyback:200] Out: 1525 [Urine:1525] Intake/Output this shift:    General appearance: alert, cooperative and no distress Resp: clear to auscultation bilaterally Cardio: regular rate and rhythm, S1, S2 normal, no murmur, click, rub or gallop GI: Soft, flat. Dressings dry and intact.  Lab Results:   [facility] 03/09/11 0402  WBC 8.4  HGB 12.3*  HCT 37.7*  PLT 174   BMET  Basename 03/09/11 0402  NA 142  K 4.0  CL 107  CO2 31  GLUCOSE 96  BUN 7  CREATININE 0.74  CALCIUM 8.8   PT/INR No results found for this basename: LABPROT:2,INR:2 in the last 72 hours  Studies/Results: Dg Chest Port 1 View  03/08/2011  *RADIOLOGY REPORT*  Clinical Data: 41 year old male status post laparoscopic abdominal surgery.  Shortness of breath, query pneumothorax.  PORTABLE CHEST - 1 VIEW  Comparison: 08/02/2009.  Findings: AP portable upright view 1102 hours.  There is a small volume of pneumoperitoneum in keeping with the recent abdominal surgery.  Spinal stimulator device in place.  Cervical ACDF hardware partially visible.  Chronically large lung volumes, slightly lower on the image. Stable cardiomegaly and mediastinal contours.  Increased interstitial markings diffusely with perihilar predominance.  Vasculature appears indistinct.  No pleural effusion or pneumothorax.  Chronic left apical scarring.  No consolidation.  IMPRESSION: 1.  Increased  interstitial markings, favor pulmonary vascular congestion. 2.  Small volume of pneumoperitoneum compatible with abdominal surgery immediate postop state. 3.  No pneumothorax.  Film reviewed in person with Dr. Franky Macho at 1108 hours on 03/08/2011.  Original Report Authenticated By: Harley Hallmark, M.D.    Anti-infectives: Anti-infectives     Start     Dose/Rate Route Frequency Ordered Stop   03/08/11 0651   ceFAZolin (ANCEF) 1-5 GM-% IVPB     Comments: MOUNCE, JENNIFER: cabinet override         03/08/11 0651 03/08/11 1859   03/08/11 0630   ceFAZolin (ANCEF) IVPB 1 g/50 mL premix  Status:  Discontinued        1 g 100 mL/hr over 30 Minutes Intravenous 60 min pre-op 03/08/11 4098 03/08/11 1212          Assessment/Plan: s/p Procedure(s): LAPAROSCOPIC NISSEN FUNDOPLICATION Impression: Stable on postoperative day one. Sleep apnea is well controlled with his CPAP machine. Will transfer patient to step down. We'll advance to full liquid diet.  LOS: 1 day    Vyctoria Dickman A 03/09/2011

## 2011-03-10 ENCOUNTER — Encounter (HOSPITAL_COMMUNITY): Payer: Self-pay | Admitting: General Surgery

## 2011-03-10 MED ORDER — PANTOPRAZOLE SODIUM 40 MG PO TBEC: 40.0000 mg | DELAYED_RELEASE_TABLET | Freq: Every day | ORAL | Status: DC

## 2011-03-10 MED ORDER — OXYCODONE-ACETAMINOPHEN 7.5-325 MG PO TABS
1.0000 | ORAL_TABLET | ORAL | Status: AC | PRN
Start: 2011-03-10 — End: 2012-03-09

## 2011-03-10 NOTE — Discharge Summary (Signed)
Physician Discharge Summary  Patient ID: Norman Zamora MRN: [id number] DOB/AGE: [birth year] 41 y.o.  Admit date: 03/08/2011 Discharge date: 03/10/2011  Admission Diagnoses: Gastroesophageal reflux disease, sleep apnea  Discharge Diagnoses: Same Active Problems:  * No active hospital problems. *    Discharged Condition: good  Hospital Course: Patient is a 41 year old white male presented to [facility] for a laparoscopic Nissen fundoplication do to intractable gastroesophageal reflux disease. He tolerated the surgery well. His postoperative course was remarkable for significant sleep apnea for which he is treated with a CPAP machine at home. Because monitoringof this, he was moved to the intensive unit to be monitored. His diet was advanced slowly after the surgery. He had no significant episodes of dysphasia or reflux. He is being discharged home on postoperative day two in good improving condition   Treatments: surgery: Laparoscopic Nissen fundoplication on 03/08/2011  Discharge Exam: Blood pressure 119/77, pulse 60, temperature 97.3 F (36.3 C), temperature source Axillary, resp. rate 14, height 6\' 4"  (1.93 m), weight 85.276 kg (188 lb), SpO2 95.00%. General appearance: alert, cooperative and no distress Resp: clear to auscultation bilaterally Cardio: regular rate and rhythm, S1, S2 normal, no murmur, click, rub or gallop GI: Soft, flat. Incisions healing well.  Disposition: 01-Home or Self Care  Discharge Orders    Future Appointments: Provider: Department: Dept Phone: Center:   05/01/2011 10:00 AM Gerrit Halls, NP [facility] [phone] [facility]     Medication List  As of 03/10/2011 10:11 AM   TAKE these medications         clonazePAM 1 MG tablet   Commonly known as: KLONOPIN   Take 0.5 mg by mouth 3 (three) times daily.      dicyclomine 10 MG capsule   Commonly known as: BENTYL   Take 10-20 mg by mouth 4 (four) times daily -  before meals and at bedtime.     divalproex 500 MG DR tablet   Commonly known as: DEPAKOTE   Take 500 mg by mouth 2 (two) times daily.      gabapentin 300 MG capsule   Commonly known as: NEURONTIN   Take 300 mg by mouth 3 (three) times daily.      meloxicam 7.5 MG tablet   Commonly known as: MOBIC   Take 7.5 mg by mouth 2 (two) times daily.      oxyCODONE-acetaminophen 7.5-325 MG per tablet   Commonly known as: PERCOCET   Take 1-2 tablets by mouth every 4 (four) hours as needed for pain.      pantoprazole 40 MG tablet   Commonly known as: PROTONIX   Take 1 tablet (40 mg total) by mouth daily.      polyethylene glycol packet   Commonly known as: MIRALAX / GLYCOLAX   Take 17 g by mouth daily as needed. For constipation      PRESCRIPTION MEDICATION   Inject 1 application as directed as needed. Dr. Eduard Clos is starting Lumbar injections for back pain on 01/18/10. Unknown name/strength/dose of medication.      promethazine 25 MG tablet   Commonly known as: PHENERGAN   Take 25 mg by mouth every 6 (six) hours as needed. Nausea and Vomiting      propranolol 60 MG tablet   Commonly known as: INDERAL   Take 60 mg by mouth daily.      traMADol 50 MG tablet   Commonly known as: ULTRAM   Take 50 mg by mouth 3 (three) times daily.  VALTREX 1000 MG tablet   Generic drug: valACYclovir   Take 1,000 mg by mouth daily.      venlafaxine 100 MG tablet   Commonly known as: EFFEXOR   Take 100 mg by mouth 2 (two) times daily.           Follow-up Information    Follow up with Dalia Heading, MD. Schedule an appointment as soon as possible for a visit on 03/16/2011.   Contact information:   [address] [place] [address] [phone]          Signed: Dalia Heading 03/10/2011, 10:11 AM

## 2011-03-10 NOTE — Discharge Instructions (Signed)
Nissen Fundoplication Care After Please read the instructions outlined below and refer to this sheet for the next few weeks. These discharge instructions provide you with general information on caring for yourself after you leave the hospital. Your doctor may also give you specific instructions. While your treatment has been planned according to the most current medical practices available, unavoidable complications sometimes happen. If you have any problems or questions after discharge, please call your doctor. ACTIVITY  Take frequent rest periods throughout the day.   Take frequent walks throughout the day. This will help to prevent blood clots.   Continue to do your coughing and deep breathing exercises once you get home. This will help to prevent pneumonia.   No strenuous activities such as heavy lifting, pushing or pulling until after your follow-up visit with your doctor. Do not lift anything heavier than 10 pounds.   Talk with your caregiver about when you may return to work and your exercise routine.   You may shower 2 days after surgery. Pat incisions dry. Do not rub incisions with washcloth or towel.   Do not drive while taking prescription pain medication.  NUTRITION  Continue with a liquid diet, or the diet you were directed to take, until your first follow-up visit with your surgeon.   Drink fluids (6-8 glasses a day).   Call your caregiver for persistent nausea (feeling sick to your stomach), vomiting, bloating or difficulty swallowing.  ELIMINATION It is very important not to strain during bowel movements. If constipation should occur, you may:  Take a mild laxative (such as Milk of Magnesia).   Add fruit and bran to your diet.   Drink more fluids.   Call your caregiver if constipation is not relieved.  FEVER If you feel feverish or have shaking chills, take your temperature. If it is 102 F (38.9 C) or above, call your caregiver. The fever may mean there is an  infection. PAIN CONTROL  If a prescription was given for a pain reliever, please follow your caregiver's directions.   Only take over-the-counter or prescription medicines for pain, discomfort, or fever as directed by your caregiver.   If the pain is not relieved by your medicine, becomes worse, or you have difficulty breathing, call your doctor.  INCISION  It is normal for your cuts (incisions) from surgery to have a small amount of drainage for the first 1-2 days. Once the drainage has stopped, leave your incision(s) open to air.   Check your incision(s) and surrounding area daily for any redness, swelling, increased drainage or bleeding. If any of these are present or if the wound edges start to separate, call your doctor.   If you have small adhesive strips in place, they will peel and fall off. (If these strips are covered with a clear bandage, your doctor will tell you when to remove them.)   If you have staples, your caregiver will remove them at the follow-up appointment.  Document Released: 08/26/2003 Document Revised: 09/14/2010 Document Reviewed: 11/29/2006 [facility] Patient Information 2012 [place], [place].

## 2011-03-10 NOTE — Progress Notes (Signed)
Pt is ready for discharge.  Reviewed all discharge information and prescription for Percocet given and reviewed with the pt; and pt verbalized understanding.  Pt is A&Ox3, VSS.  Pt's wife is here to pick him up.  Pt taken downstairs via wheelchair by L. Zenda Alpers, NT.

## 2011-04-28 ENCOUNTER — Encounter: Payer: Self-pay | Admitting: Gastroenterology

## 2011-05-01 ENCOUNTER — Ambulatory Visit (INDEPENDENT_AMBULATORY_CARE_PROVIDER_SITE_OTHER): Payer: Medicare Other | Admitting: Gastroenterology

## 2011-05-01 ENCOUNTER — Encounter: Payer: Self-pay | Admitting: Gastroenterology

## 2011-05-01 VITALS — BP 129/85 | HR 87 | Temp 97.6°F | Ht 75.0 in | Wt 189.0 lb

## 2011-05-01 DIAGNOSIS — K219 Gastro-esophageal reflux disease without esophagitis: Secondary | ICD-10-CM

## 2011-05-01 DIAGNOSIS — K589 Irritable bowel syndrome without diarrhea: Secondary | ICD-10-CM

## 2011-05-01 MED ORDER — HYDROCORTISONE ACETATE 25 MG RE SUPP: 25.0000 mg | Freq: Two times a day (BID) | RECTAL | Status: AC

## 2011-05-01 NOTE — Patient Instructions (Signed)
Continue to take the Bentyl as needed. Monitor for dry mouth, constipation.   I have sent suppositories to your pharmacy. Please contact us if you have worsening rectal discomfort.   Otherwise, we will see you back in 6 months.

## 2011-05-01 NOTE — Progress Notes (Signed)
Referring Provider: Ernestine Conrad, MD Primary Care Physician:  Ernestine Conrad, MD, MD Primary Gastroenterologist: Dr. Darrick Penna   Chief Complaint  Patient presents with  . Follow-up    HPI:   Presents in f/u after Nissen due to uncontrolled GERD noted on Bravo. Performed by Dr. Lovell Sheehan. Pt with resolution of GERD, nausea. Was constipated after surgery, stopped Bentyl. Now back to baseline of 3-4 loose stools per day, hx of IBS. Known hx of internal hemorrhoids, "hurts to wipe". Initially refusing anusol cream or suppositories.   Wt up 1 lbs.   Past Medical History  Diagnosis Date  . IBS (irritable bowel syndrome) 2009 diarrhea predominant  . Hemorrhoids, internal, with bleeding 2011  . Fatty liver 2011 184 lbs  . HA (headache)   . Chronic pain NECK  . Genital herpes   . GERD (gastroesophageal reflux disease)     BRAVO 2013 with uncontrolled GERD, referred for Nissen, completed by Dr. Lovell Sheehan  . Anxiety   . Depression   . PTSD (post-traumatic stress disorder) FROM CHILDHOOD  . N&V (nausea and vomiting)   . Chronic diarrhea   . Muscle tremor   . Pneumothorax on left   . Sleep apnea     CPAP    Past Surgical History  Procedure Date  . Implantation / placement epidural neurostimulator electrodes 1995  . Upper gastrointestinal endoscopy AUG 2011-diarrhea, dysphagia    NSAID GASTRITIS, NL DUODENUM, ESO DIL  16 MM  . Esophagogastroduodenoscopy 06/2010    distal esophageal web and ring dilated, patchy antral erythema bx showed mild chronic gastritis and no H.Pylori, distal esophageal bx was negative  . Colonoscopy 06/2010    frequent diverticula transverse colon to sigmoid colon, small internal hemorrhoid, slightly tortuous, random bx were negative. Next TCS 06/2020  . Neck surgery   . Tonsillectomy   . 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 Left lung.  . Wisdom tooth extraction   . Back surgery 2004, 2005    cervical disc fusion c3-4 and c6-7  . Bravo ph study  01/30/2011    Procedure: BRAVO PH STUDY;  Surgeon: Arlyce Harman, MD;  Location: [facility];  Service: Endoscopy;  Laterality: N/A;  Lot# [id number] Exp: 07/17/2011  . Esophageal biopsy 01/30/2011    Moderate gastritis MOST LIKELY 2o to MOBIC/ DYSPHAGIA MOST LIKeLY 2o TO UNCONTROLLED GERD, less likely eso motility disorder  . Laparoscopic nissen fundoplication 03/08/2011    Procedure: LAPAROSCOPIC NISSEN FUNDOPLICATION;  Surgeon: Dalia Heading, MD;  Location: [facility];  Service: General;  Laterality: N/A;    Current Outpatient Prescriptions  Medication Sig Dispense Refill  . clonazePAM (KLONOPIN) 1 MG tablet Take 0.5 mg by mouth 3 (three) times daily.       Marland Kitchen dicyclomine (BENTYL) 10 MG capsule Take 10-20 mg by mouth 4 (four) times daily -  before meals and at bedtime.      . divalproex (DEPAKOTE) 500 MG EC tablet Take 500 mg by mouth 2 (two) times daily.       Marland Kitchen gabapentin (NEURONTIN) 300 MG capsule Take 300 mg by mouth 3 (three) times daily.        . meloxicam (MOBIC) 7.5 MG tablet Take 7.5 mg by mouth 2 (two) times daily.       . pantoprazole (PROTONIX) 40 MG tablet Take 1 tablet (40 mg total) by mouth daily.      . polyethylene glycol (MIRALAX / GLYCOLAX) packet Take 17 g by mouth daily as needed. For  constipation       . PRESCRIPTION MEDICATION Inject 1 application as directed as needed. Dr. Eduard Clos is starting Lumbar injections for back pain on 01/18/10. Unknown name/strength/dose of medication.      . propranolol (INDERAL) 60 MG tablet Take 60 mg by mouth daily.       . traMADol (ULTRAM) 50 MG tablet Take 50 mg by mouth 3 (three) times daily.       . valACYclovir (VALTREX) 1000 MG tablet Take 1,000 mg by mouth daily.       Marland Kitchen venlafaxine (EFFEXOR) 100 MG tablet Take 100 mg by mouth 2 (two) times daily.       . hydrocortisone (ANUSOL-HC) 25 MG suppository Place 1 suppository (25 mg total) rectally every 12 (twelve) hours. For 10 days.  20 suppository  0  . oxyCODONE-acetaminophen (PERCOCET)  7.5-325 MG per tablet Take 1-2 tablets by mouth every 4 (four) hours as needed for pain.  50 tablet  0  . promethazine (PHENERGAN) 25 MG tablet Take 25 mg by mouth every 6 (six) hours as needed. Nausea and Vomiting      . DISCONTD: pantoprazole (PROTONIX) 40 MG tablet Take 1 tablet (40 mg total) by mouth daily.  30 tablet  11    Allergies as of 05/01/2011  . (No Known Allergies)    Family History  Problem Relation Age of Onset  . Colon cancer Cousin     MATERNAL, 30s  . Throat cancer Maternal Grandfather   . Anesthesia problems Neg Hx   . Hypotension Neg Hx   . Malignant hyperthermia Neg Hx   . Pseudochol deficiency Neg Hx     History   Social History  . Marital Status: Married    Spouse Name: N/A    Number of Children: N/A  . Years of Education: N/A   Social History Main Topics  . Smoking status: Current Everyday Smoker -- 1.0 packs/day for 30 years    Types: Cigarettes  . Smokeless tobacco: None  . Alcohol Use: No  . Drug Use: No  . Sexually Active: None   Other Topics Concern  . None   Social History Narrative  . None    Review of Systems: Gen: Denies fever, chills, anorexia. Denies fatigue, weakness, weight loss.  CV: Denies chest pain, palpitations, syncope, peripheral edema, and claudication. Resp: Denies dyspnea at rest, cough, wheezing, coughing up blood, and pleurisy. GI: Denies vomiting blood, jaundice, and fecal incontinence.   Denies dysphagia or odynophagia. Derm: Denies rash, itching, dry skin Psych: Denies depression, anxiety, memory loss, confusion. No homicidal or suicidal ideation.  Heme: Denies bruising, bleeding, and enlarged lymph nodes.  Physical Exam: BP 129/85  Pulse 87  Temp(Src) 97.6 F (36.4 C) (Temporal)  Ht 6\' 3"  (1.905 m)  Wt 189 lb (85.73 kg)  BMI 23.62 kg/m2 General:   Alert and oriented. No distress noted. Pleasant and cooperative.  Head:  Normocephalic and atraumatic. Eyes:  Conjuctiva clear without scleral  icterus. Mouth:  Oral mucosa pink and moist. Good dentition. No lesions. Heart:  S1, S2 present without murmurs, rubs, or gallops. Regular rate and rhythm. Abdomen:  +BS, soft, non-tender and non-distended. No rebound or guarding. No HSM or masses noted. Rectal: no signs of external hemorrhoids. Internal exam without discomfort. Reports discomfort at 7-8oclock at rectum. No obvious cyst, abscess, no fluctuance, erythema. Does not appear to be anal fissure.  Msk:  Symmetrical without gross deformities. Normal posture. Extremities:  Without edema. Neurologic:  Alert and  oriented x4;  grossly normal neurologically. Skin:  Intact without significant lesions or rashes. Psych:  Alert and cooperative. Normal mood and affect.

## 2011-05-03 ENCOUNTER — Encounter: Payer: Self-pay | Admitting: Gastroenterology

## 2011-05-03 NOTE — Assessment & Plan Note (Signed)
Improved since Nissen. Wt stable, no dysphagia. On Protonix once daily instead of BID. Pt feels great. Return in 6 mos for follow-up.

## 2011-05-03 NOTE — Assessment & Plan Note (Signed)
At baseline. Several loose stools per day. Quit taking Bentyl due to post-op constipation, obviously now resolved. Resume Bentyl. As of note, reports rectal discomfort with wiping. On physical exam, unable to appreciate any abnormalities. Known hx of internal hemorrhoids. Denies any fever/chills. Area of concern at 7-8 oclock at rectum, TTP, no fluctuance, edema, erythema, anal fissure. Unclear at this time. Will provide anusol suppositories. Pt to contact us if persisting discomfort despite getting IBS back under control. Monitor for fever, chills, any drainage. Otherwise return in 6 mos.

## 2011-05-03 NOTE — Progress Notes (Signed)
Faxed to PCP

## 2011-05-10 ENCOUNTER — Ambulatory Visit: Payer: Self-pay | Admitting: Pain Medicine

## 2011-05-15 ENCOUNTER — Ambulatory Visit: Payer: Self-pay | Admitting: Pain Medicine

## 2011-05-17 NOTE — Progress Notes (Signed)
REVIEWED.  

## 2011-05-24 ENCOUNTER — Ambulatory Visit: Payer: Self-pay | Admitting: Pain Medicine

## 2011-06-19 ENCOUNTER — Other Ambulatory Visit: Payer: Self-pay | Admitting: Gastroenterology

## 2011-06-19 ENCOUNTER — Telehealth: Payer: Self-pay | Admitting: Urgent Care

## 2011-06-19 NOTE — Telephone Encounter (Signed)
Pt called to speak with DS. I told him that she would have to call him back. You can reach him at [phone]

## 2011-06-19 NOTE — Telephone Encounter (Signed)
Pt needs refill on Protonix. I told him to have pharmacy fax over request.

## 2011-10-09 ENCOUNTER — Encounter: Payer: Self-pay | Admitting: Gastroenterology

## 2011-11-02 ENCOUNTER — Ambulatory Visit (INDEPENDENT_AMBULATORY_CARE_PROVIDER_SITE_OTHER): Payer: Medicare Other | Admitting: Gastroenterology

## 2011-11-02 ENCOUNTER — Encounter: Payer: Self-pay | Admitting: Gastroenterology

## 2011-11-02 VITALS — BP 131/80 | HR 68 | Temp 98.0°F | Ht 75.0 in | Wt 204.0 lb

## 2011-11-02 DIAGNOSIS — K589 Irritable bowel syndrome without diarrhea: Secondary | ICD-10-CM

## 2011-11-02 DIAGNOSIS — K219 Gastro-esophageal reflux disease without esophagitis: Secondary | ICD-10-CM

## 2011-11-02 DIAGNOSIS — K625 Hemorrhage of anus and rectum: Secondary | ICD-10-CM

## 2011-11-02 MED ORDER — HYDROCORTISONE ACE-PRAMOXINE 1-1 % RE CREA: TOPICAL_CREAM | RECTAL | Status: DC

## 2011-11-02 NOTE — Assessment & Plan Note (Signed)
USE BENTYL 2 BEFORE BREAKFAST, 1 BEFORE LUNCH, 2 BEFORE SUPPER, AND ONE AT BEDTIME.  ADD ALIGN DAILY FOR ONE MONTH.  IF DIARRHEA NOT IDEALLY CONTROLLED, CHANGE TO LEVSIN.  FOLLOW UP IN 3 MOS.

## 2011-11-02 NOTE — Assessment & Plan Note (Signed)
CONTROLLED AFTER NISSEN 2013. STILL NEEDS PPI.  OPV IN 3 MOS.

## 2011-11-02 NOTE — Progress Notes (Signed)
  Subjective:    Patient ID: Norman Zamora, male    DOB: 10-13-1970, 41 y.o.   MRN: [id number]  PCP: [facility]  HPI HAS QUESTIONS ABOUT IBS MEDS. BMS: 5-8(WATERY/MUCOUSY). PAIN: VERY PAINFUL & GETS BETTER WITH BMs. TAKING BENTYL 30 MINUTES PRIOR TO EATING. ACID REFLUX HAS BEEN GREAT. STILL WATERY STOOLS WITH MIRALAX. USING MIRALAX ONCE EVERY 2 WEEKS. MIRALAX HELPS TO HAVE A BM AND PAIN GETS BETTER. HAS NEVER TRIED PROBIOTICS.  Past Medical History  Diagnosis Date  . IBS (irritable bowel syndrome) 2009 diarrhea predominant  . Hemorrhoids, internal, with bleeding 2011  . Fatty liver 2011 184 lbs  . HA (headache)   . Chronic pain NECK  . Genital herpes   . GERD (gastroesophageal reflux disease)     BRAVO 2013 with uncontrolled GERD, referred for Nissen, completed by Dr. Lovell Sheehan  . Anxiety   . Depression   . PTSD (post-traumatic stress disorder) FROM CHILDHOOD  . N&V (nausea and vomiting)   . Chronic diarrhea   . Muscle tremor   . Pneumothorax on left   . Sleep apnea     CPAP    Past Surgical History  Procedure Date  . Implantation / placement epidural neurostimulator electrodes 1995  . Upper gastrointestinal endoscopy AUG 2011-diarrhea, dysphagia    NSAID GASTRITIS, NL DUODENUM, ESO DIL  16 MM  . Esophagogastroduodenoscopy 06/2010    distal esophageal web and ring dilated, patchy antral erythema bx showed mild chronic gastritis and no H.Pylori, distal esophageal bx was negative  . Colonoscopy 06/2010    frequent diverticula transverse colon to sigmoid colon, small internal hemorrhoid, slightly tortuous, random bx were negative. Next TCS 06/2020  . Neck surgery   . Tonsillectomy   . 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 Left lung.  . Wisdom tooth extraction   . Back surgery 2004, 2005    cervical disc fusion c3-4 and c6-7  . Bravo ph study 01/30/2011    Procedure: BRAVO PH STUDY;  Surgeon: Arlyce Harman, MD;  Location: [facility];  Service: Endoscopy;   Laterality: N/A;  Lot# [id number] Exp: 07/17/2011  . Esophageal biopsy 01/30/2011    Moderate gastritis MOST LIKELY 2o to MOBIC/ DYSPHAGIA MOST LIKeLY 2o TO UNCONTROLLED GERD, less likely eso motility disorder  . Laparoscopic nissen fundoplication 03/08/2011    Procedure: LAPAROSCOPIC NISSEN FUNDOPLICATION;  Surgeon: Dalia Heading, MD;  Location: [facility];  Service: General;  Laterality: N/A;      Review of Systems     Objective:   Physical Exam  Vitals reviewed. Constitutional: He is oriented to person, place, and time. He appears well-nourished. No distress.  HENT:  Head: Normocephalic and atraumatic.  Mouth/Throat: Oropharynx is clear and moist. No oropharyngeal exudate.  Eyes: Pupils are equal, round, and reactive to light. No scleral icterus.  Neck: Normal range of motion. Neck supple.  Cardiovascular: Normal rate, regular rhythm and normal heart sounds.   Pulmonary/Chest: Effort normal and breath sounds normal. No respiratory distress.  Abdominal: Soft. Bowel sounds are normal. He exhibits no distension. There is no tenderness.  Neurological: He is alert and oriented to person, place, and time.       NO  NEW FOCAL DEFICITS   Psychiatric:       FLAT AFFECT, NL MOOD          Assessment & Plan:

## 2011-11-02 NOTE — Assessment & Plan Note (Signed)
DUE TO IH   PROCTOCREAM FOUR TIMES A DAY FOR 5 DAYS-NO MORE THAN 15 DAYS/MONTH TO RELIVE HEMORRHOID PAIN/BLEEDING.

## 2011-11-02 NOTE — Patient Instructions (Addendum)
USE BENTYL 2 BEFORE BREAKFAST, 1 BEFORE LUNCH, 2 BEFORE SUPPER, AND ONE AT BEDTIME.  ADD ALIGN DAILY FOR ONE MONTH.  IF DIARRHEA NOT IDEALLY CONTROLLED, CHANGE TO LEVSIN.  PROCTOCREAM FOUR TIMES A DAY FOR 5 DAYS-NO MORE THAN 15 DAYS/MONTH TO RELIVE HEMORRHOID PAIN/BLEEDING.  FOLLOW UP IN 3 MOS.

## 2011-11-02 NOTE — Progress Notes (Signed)
Faxed to PCP

## 2011-11-03 ENCOUNTER — Other Ambulatory Visit: Payer: Self-pay | Admitting: Gastroenterology

## 2011-11-29 NOTE — Progress Notes (Signed)
Reminder in epic to follow up in 3 months with SF in E30 

## 2011-11-30 ENCOUNTER — Telehealth: Payer: Self-pay

## 2011-11-30 MED ORDER — HYOSCYAMINE SULFATE 0.125 MG SL SUBL: SUBLINGUAL_TABLET | SUBLINGUAL | Status: DC

## 2011-11-30 MED ORDER — ALIGN PO CAPS: 1.0000 | ORAL_CAPSULE | Freq: Every day | ORAL | Status: AC

## 2011-11-30 NOTE — Telephone Encounter (Signed)
LMOM to call.

## 2011-11-30 NOTE — Telephone Encounter (Signed)
Pt called and was informed.  

## 2011-11-30 NOTE — Telephone Encounter (Signed)
PLEASE CALL PT.  STOP USING BENTYL. CONTINUE ALIGN. RX FOR MEDS SENT TO PHARMACY.

## 2011-11-30 NOTE — Telephone Encounter (Signed)
Pt called and said he has been on the Align and it hasn't seemed to help his diarrhea. He said Dr. Darrick Penna was going to call in Levsin if it did not work and he would like Rx sent to [facility]  In [place].

## 2012-01-04 ENCOUNTER — Other Ambulatory Visit: Payer: Self-pay

## 2012-01-04 MED ORDER — DICYCLOMINE HCL 10 MG PO CAPS: ORAL_CAPSULE | ORAL | Status: DC

## 2012-01-15 ENCOUNTER — Encounter: Payer: Self-pay | Admitting: *Deleted

## 2012-02-28 ENCOUNTER — Encounter: Payer: Self-pay | Admitting: Gastroenterology

## 2012-02-28 ENCOUNTER — Ambulatory Visit (INDEPENDENT_AMBULATORY_CARE_PROVIDER_SITE_OTHER): Payer: Medicare PPO | Admitting: Gastroenterology

## 2012-02-28 VITALS — BP 133/75 | HR 75 | Temp 98.2°F | Ht 75.0 in | Wt 209.2 lb

## 2012-02-28 DIAGNOSIS — K625 Hemorrhage of anus and rectum: Secondary | ICD-10-CM

## 2012-02-28 DIAGNOSIS — K219 Gastro-esophageal reflux disease without esophagitis: Secondary | ICD-10-CM

## 2012-02-28 DIAGNOSIS — K589 Irritable bowel syndrome without diarrhea: Secondary | ICD-10-CM

## 2012-02-28 MED ORDER — HYOSCYAMINE SULFATE 0.125 MG SL SUBL: SUBLINGUAL_TABLET | SUBLINGUAL | Status: DC

## 2012-02-28 MED ORDER — PANTOPRAZOLE SODIUM 40 MG PO TBEC: DELAYED_RELEASE_TABLET | ORAL | Status: DC

## 2012-02-28 NOTE — Patient Instructions (Signed)
YOU WILL HAVE HEMORRHOID BANDING ON APR 1.  INCREASE YOUR PROTONIX TO TWICE DAILY.  FOLLOW A LOW FAT DIET.  LOSE WEIGHT.  USE LEVSIN OR BENTYL FOR ABDOMINAL CRAMPS AND DIARRHEA.  FOLLOW UP IN 2 MOS.  Low-Fat Diet BREADS, CEREALS, PASTA, RICE, DRIED PEAS, AND BEANS These products are high in carbohydrates and most are low in fat. Therefore, they can be increased in the diet as substitutes for fatty foods. They too, however, contain calories and should not be eaten in excess. Cereals can be eaten for snacks as well as for breakfast.   FRUITS AND VEGETABLES It is good to eat fruits and vegetables. Besides being sources of fiber, both are rich in vitamins and some minerals. They help you get the daily allowances of these nutrients. Fruits and vegetables can be used for snacks and desserts.  MEATS Limit lean meat, chicken, [place], and fish to no more than 6 ounces per day. Beef, Pork, and Lamb Use lean cuts of beef, pork, and lamb. Lean cuts include:  Extra-lean ground beef.  Arm roast.  Sirloin tip.  Center-cut ham.  Round steak.  Loin chops.  Rump roast.  Tenderloin.  Trim all fat off the outside of meats before cooking. It is not necessary to severely decrease the intake of red meat, but lean choices should be made. Lean meat is rich in protein and contains a highly absorbable form of iron. Premenopausal women, in particular, should avoid reducing lean red meat because this could increase the risk for low red blood cells (iron-deficiency anemia).  Chicken and [place] These are good sources of protein. The fat of poultry can be reduced by removing the skin and underlying fat layers before cooking. Chicken and [place] can be substituted for lean red meat in the diet. Poultry should not be fried or covered with high-fat sauces. Fish and Shellfish Fish is a good source of protein. Shellfish contain cholesterol, but they usually are low in saturated fatty acids. The preparation of fish is  important. Like chicken and [place], they should not be fried or covered with high-fat sauces. EGGS Egg whites contain no fat or cholesterol. They can be eaten often. Try 1 to 2 egg whites instead of whole eggs in recipes or use egg substitutes that do not contain yolk. MILK AND DAIRY PRODUCTS Use skim or 1% milk instead of 2% or whole milk. Decrease whole milk, natural, and processed cheeses. Use nonfat or low-fat (2%) cottage cheese or low-fat cheeses made from vegetable oils. Choose nonfat or low-fat (1 to 2%) yogurt. Experiment with evaporated skim milk in recipes that call for heavy cream. Substitute low-fat yogurt or low-fat cottage cheese for sour cream in dips and salad dressings. Have at least 2 servings of low-fat dairy products, such as 2 glasses of skim (or 1%) milk each day to help get your daily calcium intake. FATS AND OILS Reduce the total intake of fats, especially saturated fat. Butterfat, lard, and beef fats are high in saturated fat and cholesterol. These should be avoided as much as possible. Vegetable fats do not contain cholesterol, but certain vegetable fats, such as coconut oil, palm oil, and palm kernel oil are very high in saturated fats. These should be limited. These fats are often used in bakery goods, processed foods, popcorn, oils, and nondairy creamers. Vegetable shortenings and some peanut butters contain hydrogenated oils, which are also saturated fats. Read the labels on these foods and check for saturated vegetable oils. Unsaturated vegetable oils and fats  do not raise blood cholesterol. However, they should be limited because they are fats and are high in calories. Total fat should still be limited to 30% of your daily caloric intake. Desirable liquid vegetable oils are corn oil, cottonseed oil, olive oil, canola oil, safflower oil, soybean oil, and sunflower oil. Peanut oil is not as good, but small amounts are acceptable. Buy a heart-healthy tub margarine that has no  partially hydrogenated oils in the ingredients. Mayonnaise and salad dressings often are made from unsaturated fats, but they should also be limited because of their high calorie and fat content. Seeds, nuts, peanut butter, olives, and avocados are high in fat, but the fat is mainly the unsaturated type. These foods should be limited mainly to avoid excess calories and fat. OTHER EATING TIPS Snacks  Most sweets should be limited as snacks. They tend to be rich in calories and fats, and their caloric content outweighs their nutritional value. Some good choices in snacks are graham crackers, melba toast, soda crackers, bagels (no egg), English muffins, fruits, and vegetables. These snacks are preferable to snack crackers, [place] fries, TORTILLA CHIPS, and POTATO chips. Popcorn should be air-popped or cooked in small amounts of liquid vegetable oil. Desserts Eat fruit, low-fat yogurt, and fruit ices instead of pastries, cake, and cookies. Sherbet, angel food cake, gelatin dessert, frozen low-fat yogurt, or other frozen products that do not contain saturated fat (pure fruit juice bars, frozen ice pops) are also acceptable.  COOKING METHODS Choose those methods that use little or no fat. They include: Poaching.  Braising.  Steaming.  Grilling.  Baking.  Stir-frying.  Broiling.  Microwaving.  Foods can be cooked in a nonstick pan without added fat, or use a nonfat cooking spray in regular cookware. Limit fried foods and avoid frying in saturated fat. Add moisture to lean meats by using water, broth, cooking wines, and other nonfat or low-fat sauces along with the cooking methods mentioned above. Soups and stews should be chilled after cooking. The fat that forms on top after a few hours in the refrigerator should be skimmed off. When preparing meals, avoid using excess salt. Salt can contribute to raising blood pressure in some people.  EATING AWAY FROM HOME Order entres, potatoes, and vegetables  without sauces or butter. When meat exceeds the size of a deck of cards (3 to 4 ounces), the rest can be taken home for another meal. Choose vegetable or fruit salads and ask for low-calorie salad dressings to be served on the side. Use dressings sparingly. Limit high-fat toppings, such as bacon, crumbled eggs, cheese, sunflower seeds, and olives. Ask for heart-healthy tub margarine instead of butter.

## 2012-02-28 NOTE — Progress Notes (Signed)
Faxed to PCP

## 2012-02-28 NOTE — Assessment & Plan Note (Signed)
TRY LEVSIN.  IF TOO EXPENSIVE PT WILL NEED BENTYL 20 MG QAC AND HS. OPV IN 2 MOS.

## 2012-02-28 NOTE — Progress Notes (Signed)
Subjective:    Patient ID: Norman Zamora, male    DOB: [birth year], 42 y.o.   MRN: [id number]  PCP: [facility]  HPI  Still having 5-8 bms a day. Usually worse in the morning and at night. Unable to AFFORD LEVSIN. TAKING 2/1/2/2. STILL HAVING RECTAL BLEEDING. SEEING AT LEAST 1-3 TIMES A DAY(WHEN HE WIPES, IN STOOL, AND IN THE BOWL). NO RECTAL PAIN OR ITCHING. WHEN HE HAS A FLARE IT'S HARD TO WALK OR SIT. HEARTBURN COMING BACK. FOOD NOT TASTING GREAT. HAVING NAUSEA. A LITTLE PROBLEM SWALLOWING PILLS/PEANUT BUTTER/BREAD. HEARTBURN: EVERY DAY. EATING THE SAME THING. JUST PROTONIX AT NIGHT. NO PROBLEMS WITH SWELLING IN YOUR LEGS OR SORES IN YOUR MOUTH. HAS ABD PAIN ALL THE TIME.  PT DENIES FEVER, CHILLS,  vomiting, melena,constipation. heartburn or indigestion.  Past Medical History  Diagnosis Date  . IBS (irritable bowel syndrome) 2009 diarrhea predominant  . Hemorrhoids, internal, with bleeding 2011  . Fatty liver 2011 184 lbs  . HA (headache)   . Chronic pain NECK  . Genital herpes   . GERD (gastroesophageal reflux disease)     BRAVO 2013 with uncontrolled GERD, referred for Nissen, completed by Dr. Lovell Sheehan  . Anxiety   . Depression   . PTSD (post-traumatic stress disorder) FROM CHILDHOOD  . N&V (nausea and vomiting)   . Chronic diarrhea   . Muscle tremor   . Pneumothorax on left   . Sleep apnea     CPAP   Past Surgical History  Procedure Laterality Date  . Implantation / placement epidural neurostimulator electrodes  1995  . Upper gastrointestinal endoscopy  AUG 2011-diarrhea, dysphagia    NSAID GASTRITIS, NL DUODENUM, ESO DIL  16 MM  . Esophagogastroduodenoscopy  06/2010    distal esophageal web and ring dilated, patchy antral erythema bx showed mild chronic gastritis and no H.Pylori, distal esophageal bx was negative  . Colonoscopy  06/2010    frequent diverticula transverse colon to sigmoid colon, small internal hemorrhoid, slightly tortuous, random bx were negative. Next TCS  06/2020  . Neck surgery    . Tonsillectomy    . 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 Left lung.  . Wisdom tooth extraction    . Back surgery  2004, 2005    cervical disc fusion c3-4 and c6-7  . Bravo ph study  01/30/2011    Procedure: BRAVO PH STUDY;  Surgeon: Arlyce Harman, MD;  Location: [facility];  Service: Endoscopy;  Laterality: N/A;  Lot# [id number] Exp: 07/17/2011  . Esophageal biopsy  01/30/2011    Moderate gastritis MOST LIKELY 2o to MOBIC/ DYSPHAGIA MOST LIKeLY 2o TO UNCONTROLLED GERD, less likely eso motility disorder  . Laparoscopic nissen fundoplication  03/08/2011    Procedure: LAPAROSCOPIC NISSEN FUNDOPLICATION;  Surgeon: Dalia Heading, MD;  Location: [facility];  Service: General;  Laterality: N/A;    No Known Allergies  Current Outpatient Prescriptions  Medication Sig Dispense Refill  . bifidobacterium infantis (ALIGN) capsule Take 1 capsule by mouth daily.    . clonazePAM (KLONOPIN) 1 MG tablet Take 0.5 mg by mouth 3 (three) times daily.     Marland Kitchen dicyclomine (BENTYL) 10 MG capsule Take 2 before breakfast, one before lunch, two before evening meal.    . divalproex (DEPAKOTE) 500 MG EC tablet Take 500 mg by mouth 2 (two) times daily.     Marland Kitchen gabapentin (NEURONTIN) 300 MG capsule Take 300 mg by mouth 3 (three) times daily.      Marland Kitchen  meloxicam (MOBIC) 7.5 MG tablet Take 7.5 mg by mouth 2 (two) times daily.     Marland Kitchen oxyCODONE-acetaminophen (PERCOCET) 7.5-325 MG per tablet Take 1-2 tablets by mouth every 4 (four) hours as needed for pain.    . pantoprazole (PROTONIX) 40 MG tablet TAKE ONE TABLET BY MOUTH EVERY NIGHT    .        Marland Kitchen PRESCRIPTION MEDICATION Inject 1 application as directed as needed. Dr. Eduard Clos is starting Lumbar injections for back pain on 01/18/10. Unknown name/strength/dose of medication.      . propranolol (INDERAL) 60 MG tablet Take 60 mg by mouth daily.       . traMADol (ULTRAM) 50 MG tablet Take 50 mg by mouth 3 (three) times daily.       .  valACYclovir (VALTREX) 1000 MG tablet Take 1,000 mg by mouth daily.       Marland Kitchen venlafaxine (EFFEXOR) 100 MG tablet Take 100 mg by mouth 2 (two) times daily.       .      .      .        .      . promethazine (PHENERGAN) 25 MG tablet Take 25 mg by mouth every 6 (six) hours as needed. Nausea and Vomiting       No current facility-administered medications for this visit.      Review of Systems     Objective:   Physical Exam  Vitals reviewed. Constitutional: He is oriented to person, place, and time. He appears well-nourished. No distress.  HENT:  Head: Normocephalic and atraumatic.  Mouth/Throat: Oropharynx is clear and moist. No oropharyngeal exudate.  Eyes: Pupils are equal, round, and reactive to light. No scleral icterus.  Neck: Normal range of motion. Neck supple.  Cardiovascular: Normal rate, regular rhythm and normal heart sounds.   Pulmonary/Chest: Effort normal and breath sounds normal.  Abdominal: Soft. Bowel sounds are normal. He exhibits no distension. There is tenderness.  MILD TTP IN THE PERI-UMBILICAL REGION   Musculoskeletal: He exhibits no edema.  Neurological: He is alert and oriented to person, place, and time.  NO  NEW FOCAL DEFICITS   Psychiatric:  FLAT AFFECT, NL MOOD           Assessment & Plan:

## 2012-02-28 NOTE — Assessment & Plan Note (Signed)
SX NOT IDEALLY CONTROLLED ON QD PROTONIX.  INCREASE TO BID. LOW FAT DIET LOSE WEIGHT OPV IN 2 MOS.

## 2012-02-28 NOTE — Assessment & Plan Note (Signed)
PT FAILED MEDICAL MANAGEMENT. SX HARD TO CONTROL DUE TO DIARRHEA. LAST TCS 2012.  SCHEDULE FOR [facility] BANDING. EXPLAINED PROCEDURE TO PT. HE HAS WATCHED THE VIDEO. OPV IN 2 MOS.

## 2012-03-15 NOTE — Progress Notes (Signed)
Pt is aware of OV on 4/23 at 2 with SF and appt card was mailed

## 2012-03-28 ENCOUNTER — Telehealth: Payer: Self-pay

## 2012-03-28 MED ORDER — PANTOPRAZOLE SODIUM 40 MG PO TBEC: DELAYED_RELEASE_TABLET | ORAL | Status: DC

## 2012-03-28 MED ORDER — LOPERAMIDE HCL 2 MG PO TABS: ORAL_TABLET | ORAL | Status: DC

## 2012-03-28 NOTE — Telephone Encounter (Signed)
Called to inform pt. He said it should have been sent to [facility]. Also, he said he needs one sent for the Protonix bid, that had not been changed.

## 2012-03-28 NOTE — Telephone Encounter (Addendum)
FAX RX TO [facility]. NO FAX NUMBER IN SYSTEM. THEN CALL PT TO LET HIM KNOW THE RX HAS BEEN SENT.

## 2012-03-28 NOTE — Telephone Encounter (Signed)
PLEASE CALL PT. RX FOR LOPERAMIDE SENT TO THE PHARMACY. PT CAN CALL IF SX NOT WELL CONTROLLED. HE MAY NEED TO RESTART HIS BENTYL.

## 2012-03-28 NOTE — Telephone Encounter (Signed)
I faxed([phone]) the Rx to [facility].

## 2012-03-28 NOTE — Telephone Encounter (Signed)
Pt called- his insurance wont pay for the levsin anymore and they informed him that his formulary alternative is loperamide. He wants to know if you feel like this is something he can take and if so can he have an rx sent to the pharmacy. Please advise.

## 2012-03-28 NOTE — Addendum Note (Signed)
Addended by: [place] on: 03/28/2012 03:04 PM   Modules accepted: Orders

## 2012-04-18 ENCOUNTER — Other Ambulatory Visit (HOSPITAL_COMMUNITY): Payer: Self-pay | Admitting: *Deleted

## 2012-04-18 ENCOUNTER — Ambulatory Visit (HOSPITAL_COMMUNITY)
Admission: RE | Admit: 2012-04-18 | Discharge: 2012-04-18 | Disposition: A | Discharge status: Home / Self Care | Payer: Disability Insurance | Source: Ambulatory Visit | Attending: Family Medicine | Admitting: Family Medicine

## 2012-04-18 DIAGNOSIS — M542 Cervicalgia: Secondary | ICD-10-CM

## 2012-04-18 DIAGNOSIS — M545 Low back pain, unspecified: Secondary | ICD-10-CM

## 2012-05-02 ENCOUNTER — Telehealth: Payer: Self-pay

## 2012-05-02 NOTE — Telephone Encounter (Signed)
Pt said that his hemorrhoids are better and wanted to know if you still wanted to do the hemorrhoid banding? He has an appointment on Monday 05/06/12 @2 :00 with [facility]. Please advise.

## 2012-05-02 NOTE — Telephone Encounter (Signed)
Pt is aware. I canceled his appointment for Monday 05/06/12 with [facility].

## 2012-05-02 NOTE — Telephone Encounter (Signed)
PLEASE CALL PT. IF HIS HEMORRHOIDS ARE BETTER, HE CAN WAIT FOR NOW. HE SHOULD HAVE BANDING FOR BETTER LONG TERM MANAGEMENT. WE HAD TO CANCEL THE SESSION FOR APR 28 BUT WILL BE CONTACTING PT FOR A SESSION IN [place] ON MAY 21.

## 2012-05-06 ENCOUNTER — Ambulatory Visit: Payer: Medicare PPO | Admitting: Gastroenterology

## 2012-05-08 ENCOUNTER — Ambulatory Visit: Payer: Medicare PPO | Admitting: Gastroenterology

## 2012-05-09 ENCOUNTER — Telehealth: Payer: Self-pay | Admitting: Gastroenterology

## 2012-05-09 NOTE — Telephone Encounter (Signed)
CALLED PT. NEED TO [facility] BANDING FOR MAY 21 IN [place]. PT AGREEABLE.

## 2012-06-05 ENCOUNTER — Encounter: Payer: Self-pay | Admitting: Gastroenterology

## 2012-06-05 ENCOUNTER — Ambulatory Visit (INDEPENDENT_AMBULATORY_CARE_PROVIDER_SITE_OTHER): Payer: Medicare PPO | Admitting: Gastroenterology

## 2012-06-05 VITALS — BP 120/70

## 2012-06-05 DIAGNOSIS — K648 Other hemorrhoids: Secondary | ICD-10-CM

## 2012-06-05 NOTE — Patient Instructions (Addendum)
HEMORRHOID BANDING PROCEDURE    FOLLOW-UP CARE   1. The procedure you have had should have been relatively painless since the banding of the area involved does not have nerve endings and there is no pain sensation.  The rubber band cuts off the blood supply to the hemorrhoid and the band may fall off as soon as 48 hours after the banding (the band may occasionally be seen in the toilet bowl following a bowel movement). You may notice a temporary feeling of fullness in the rectum which should respond adequately to plain Tylenol or Motrin.  2. Following the banding, avoid strenuous exercise that evening and resume full activity the next day.  A sitz bath (soaking in a warm tub) or bidet is soothing, and can be useful for cleansing the area after bowel movements.     3. To avoid constipation, take two tablespoons of natural wheat bran, natural oat bran, flax, Benefiber or any over the counter fiber supplement and increase your water intake to 7-8 glasses daily.    4. Unless you have been prescribed anorectal medication, do not put anything inside your rectum for two weeks: No suppositories, enemas, fingers, etc.  5. Occasionally, you may have more bleeding than usual after the banding procedure.  This is often from the untreated hemorrhoids rather than the treated one.  Don't be concerned if there is a tablespoon or so of blood.  If there is more blood than this, lie flat with your bottom higher than your head and apply an ice pack to the area. If the bleeding does not stop within a half an hour or if you feel faint, call our office at [phone]  or go to the emergency room.  6. Problems are not common; however, if there is a substantial amount of bleeding, severe pain, chills, fever or difficulty passing urine (very rare) or other problems, you should call us at [phone] or report to the nearest emergency room.  7. Do not stay seated continuously for more than 2-3 hours for a day or two  after the procedure.  Tighten your buttock muscles 10-15 times every two hours and take 10-15 deep breaths every 1-2 hours.  Do not spend more than a few minutes on the toilet if you cannot empty your bowel; instead re-visit the toilet at a later time.    

## 2012-06-19 NOTE — Progress Notes (Signed)
SYMPTOMS: RECTAL BLEEDING, PRESSURE, PAIN, ITCHING, BURNING.  CONSTIPATION: NO DIARRHEA: YES  STRAINS WITH BMs: YES  TIME SPENT ON TOILET: 15 MINS TISSUE POKES OUT OF RECTUM: YES- GOES BACK BY ITSELF FIBER SUPPLEMENTS: NO  GLASSES OF WATER/DAY: 6-8   ADDITIONAL QUESTIONS:  LATEX ALLERGY: NO PREGNANT: NO ERECTILE DYSFUNCTION MEDS OR NITRATES: NO ANTICOAGULATION/ANTIPLATELET MEDS: NO DIAGNOSED WITH CROHN'S DISEASE, PROCTITIS, PORTAL HTN, OR ANAL/RECTAL CA: NO TAKING IMMUNOSUPPRESSANTS/XRT: NO  

## 2012-06-24 IMAGING — US US ABDOMEN COMPLETE
1 series · 14 of 25 positions shown · non-contrast
Comparison: None

CLINICAL DATA: Weight loss, vomiting, abdominal pain, nausea

ULTRASOUND ABDOMEN:
TECHNIQUE: Sonography of upper abdominal structures was performed.

[Series 1: us abdomen complete · 0.35mm/px · 14 of 66 slices shown]
[im 1/66]
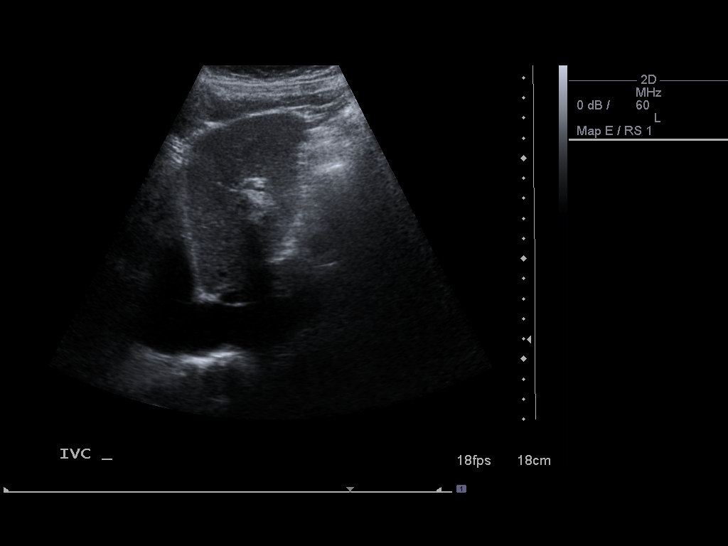
[im 6/66]
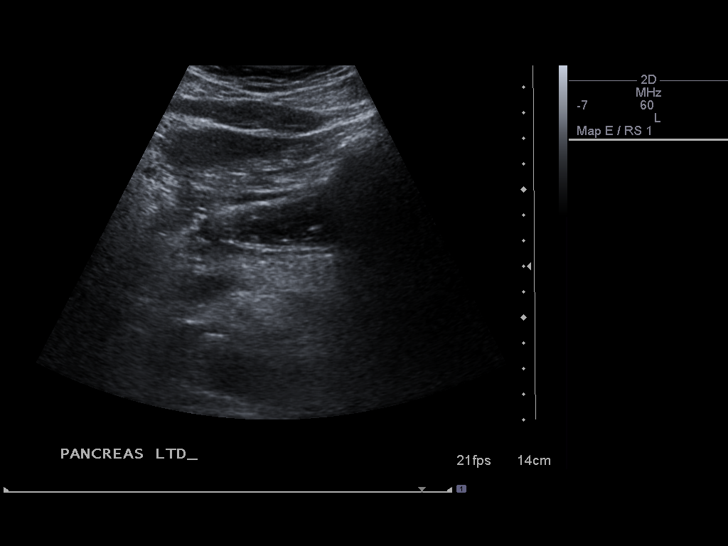
[im 11/66]
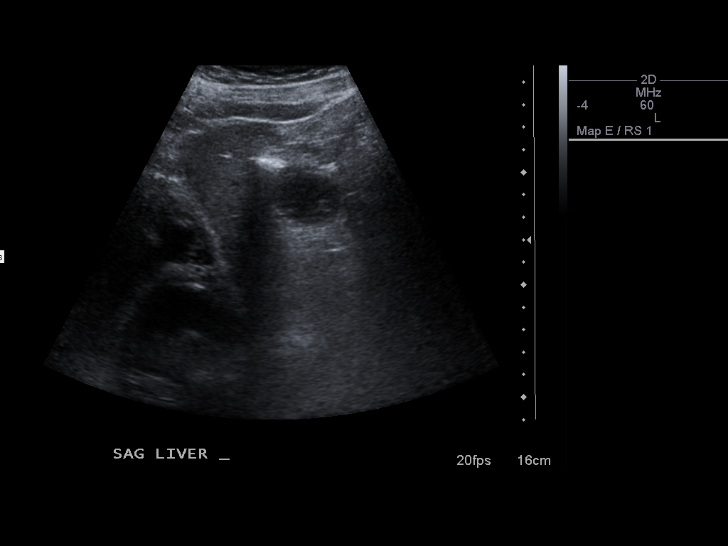
[im 17/66]
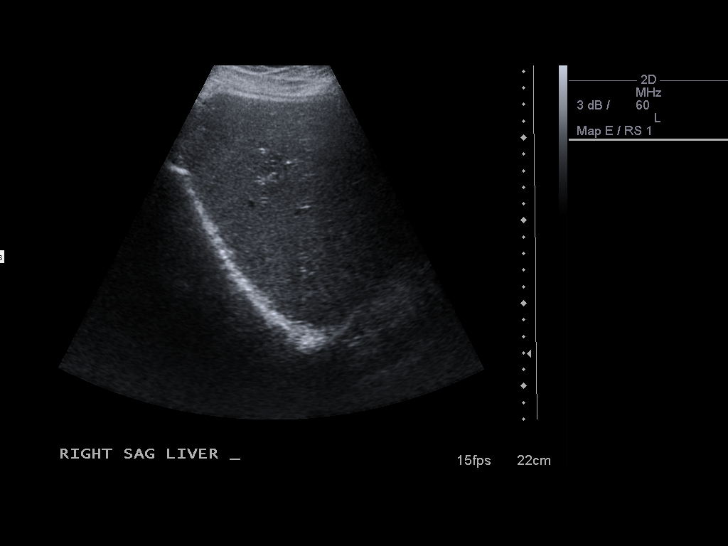
[im 22/66]
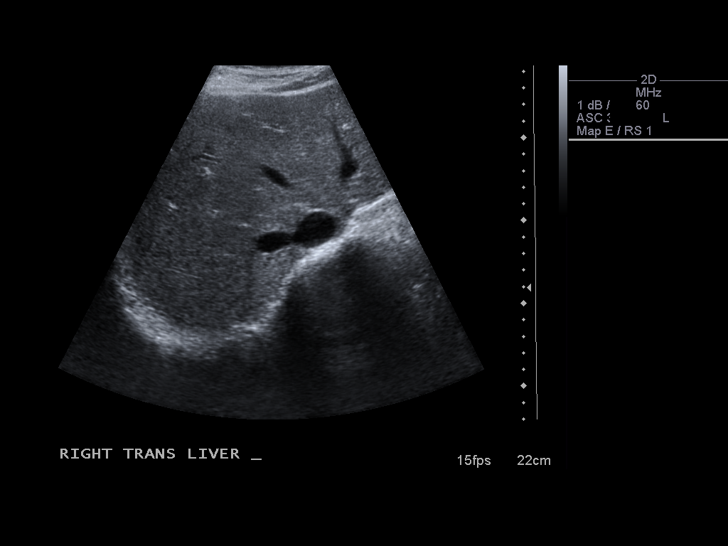
[im 25/66]
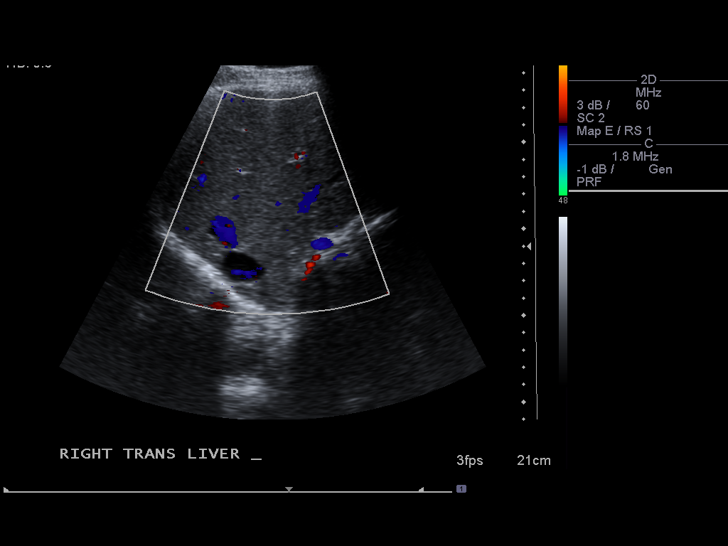
[im 30/66]
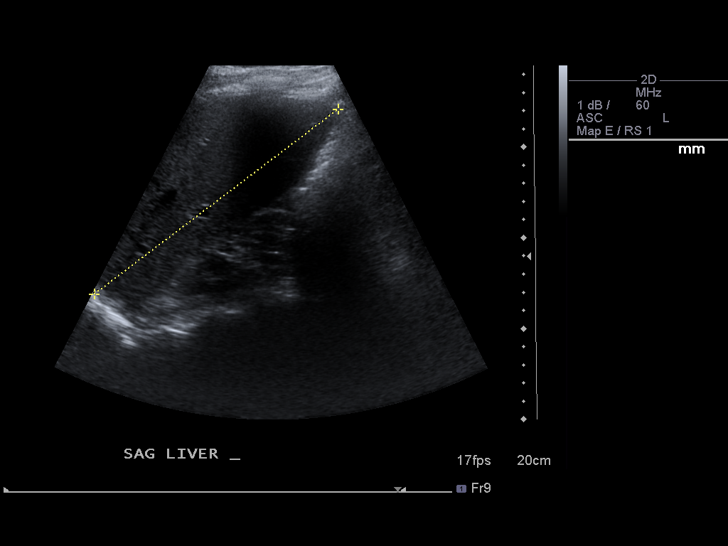
[im 36/66]
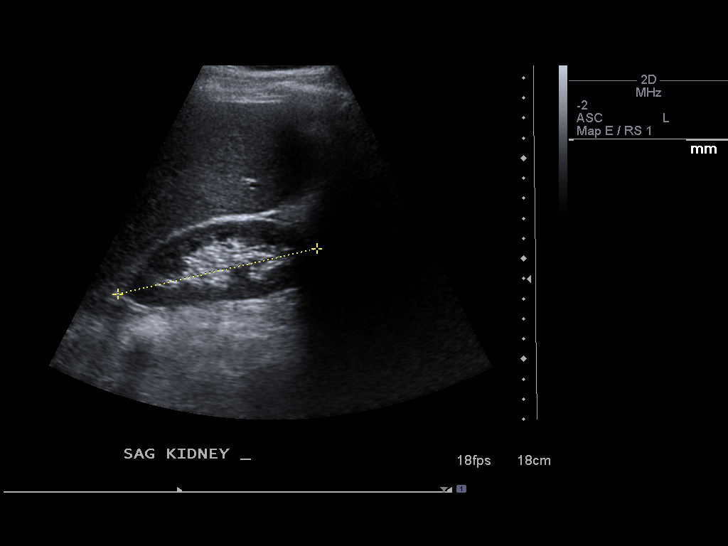
[im 41/66]
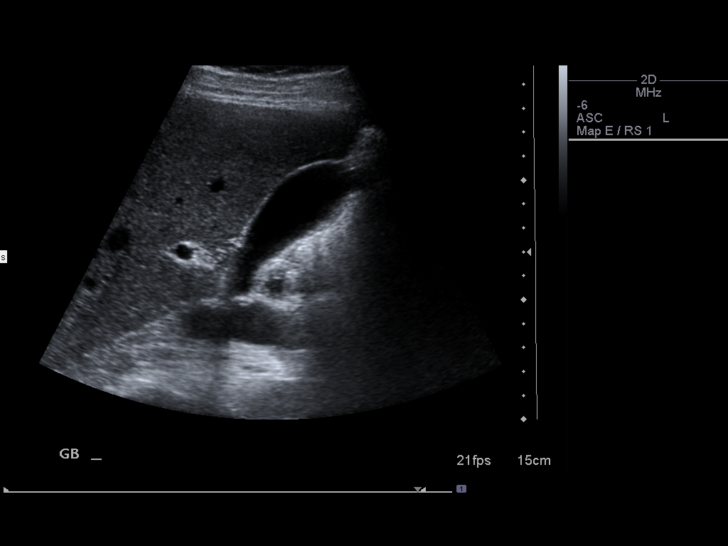
[im 44/66]
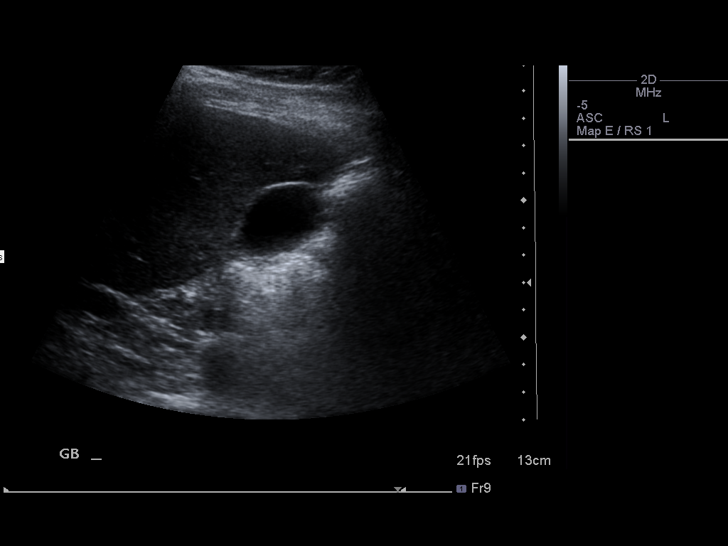
[im 49/66]
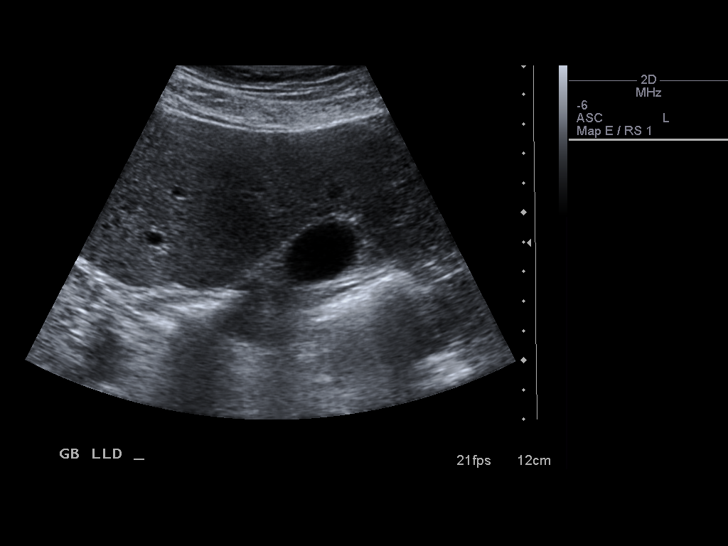
[im 55/66]
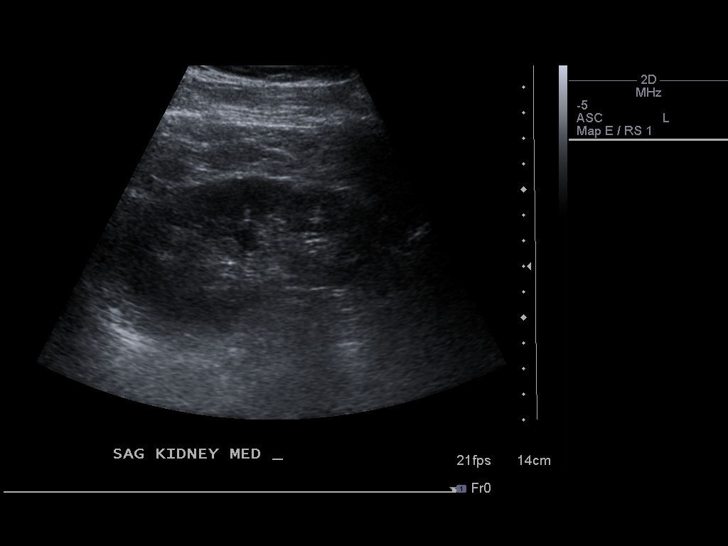
[im 60/66]
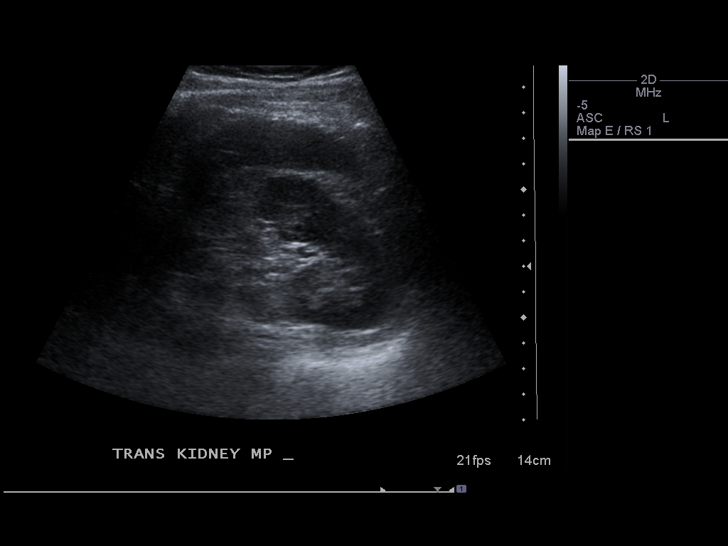
[im 66/66]
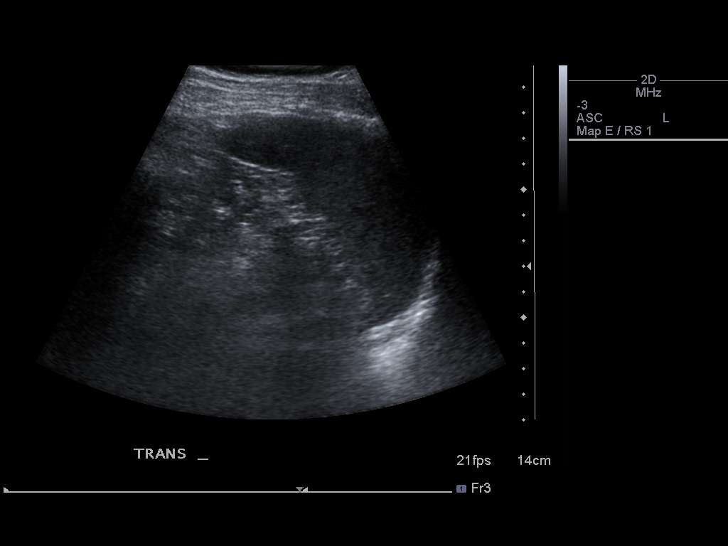

[14 of 25 positions shown; findings below may reference images not displayed]

Gallbladder:  Normally distended without stones or wall thickening.
No pericholecystic fluid or sonographic Murphy sign.

Common bile duct:  Normal caliber 4 mm diameter

Liver:  Normal appearance

IVC:  Normal appearance

Pancreas:  Pancreatic body and proximal tail normal appearance,
remainder obscured by bowel gas

Spleen:  Normal appearance, 8.2 cm length

Right kidney:  10.2 cm length. Normal morphology without mass or
hydronephrosis.

Left kidney:  11.7 cm length. Normal morphology without mass or
hydronephrosis.

Aorta:  Normal caliber

Other:  No free fluid
IMPRESSION: Incomplete pancreatic visualization as above.
Otherwise normal exam.

## 2012-06-26 ENCOUNTER — Ambulatory Visit: Payer: Self-pay | Admitting: Pain Medicine

## 2012-06-27 ENCOUNTER — Ambulatory Visit (INDEPENDENT_AMBULATORY_CARE_PROVIDER_SITE_OTHER): Payer: Medicare PPO | Admitting: Gastroenterology

## 2012-06-27 ENCOUNTER — Encounter (HOSPITAL_COMMUNITY): Payer: Self-pay | Admitting: Pharmacy Technician

## 2012-06-27 ENCOUNTER — Encounter: Payer: Self-pay | Admitting: Gastroenterology

## 2012-06-27 VITALS — BP 134/79 | HR 69 | Temp 98.4°F | Ht 75.0 in | Wt 210.4 lb

## 2012-06-27 DIAGNOSIS — K602 Anal fissure, unspecified: Secondary | ICD-10-CM

## 2012-06-27 DIAGNOSIS — R1319 Other dysphagia: Secondary | ICD-10-CM

## 2012-06-27 DIAGNOSIS — R1013 Epigastric pain: Secondary | ICD-10-CM

## 2012-06-27 DIAGNOSIS — K648 Other hemorrhoids: Secondary | ICD-10-CM

## 2012-06-27 NOTE — Patient Instructions (Addendum)
FOLLOW A HIGH FIBER DIET. AVOID ITEMS THAT CAUSE BLOATING AND GAS. SEE INFO BELOW.  DRINK WATER TO KEEP HER URINE LIGHT YELLOW.  Use NITROGLYCERIN ointment 0.125% three times a day for 2-3 MOS.  USE FIBER POWDER THREE TIMES A DAY.  SIT FOR LESS THAN 2 MINUTES ON THE COMMODE.  FOLLOW UP AFTER Jul 27, 2012.     High-Fiber Diet A high-fiber diet changes your normal diet to include more whole grains, legumes, fruits, and vegetables. Changes in the diet involve replacing refined carbohydrates with unrefined foods. The calorie level of the diet is essentially unchanged. The Dietary Reference Intake (recommended amount) for adult males is 38 grams per day. For adult females, it is 25 grams per day. Pregnant and lactating women should consume 28 grams of fiber per day. Fiber is the intact part of a plant that is not broken down during digestion. Functional fiber is fiber that has been isolated from the plant to provide a beneficial effect in the body. PURPOSE  Increase stool bulk.   Ease and regulate bowel movements.   Lower cholesterol.  INDICATIONS THAT YOU NEED MORE FIBER  Constipation and hemorrhoids.   Uncomplicated diverticulosis (intestine condition) and irritable bowel syndrome.   Weight management.   As a protective measure against hardening of the arteries (atherosclerosis), diabetes, and cancer.   DO NOT USE WITH:  Acute diverticulitis (intestine infection).   Partial small bowel obstructions.   Complicated diverticular disease involving bleeding, rupture (perforation), or abscess (boil, furuncle).   Presence of autonomic neuropathy (nerve damage) or gastroparesis (stomach cannot empty itself).    GUIDELINES FOR INCREASING FIBER IN THE DIET  Start adding fiber to the diet slowly. A gradual increase of about 5 more grams (2 slices of whole-wheat bread, 2 servings of most fruits or vegetables, or 1 bowl of high-fiber cereal) per day is best. Too rapid an increase in  fiber may result in constipation, flatulence, and bloating.   Drink enough water and fluids to keep your urine clear or pale yellow. Water, juice, or caffeine-free drinks are recommended. Not drinking enough fluid may cause constipation.   Eat a variety of high-fiber foods rather than one type of fiber.   Try to increase your intake of fiber through using high-fiber foods rather than fiber pills or supplements that contain small amounts of fiber.   The goal is to change the types of food eaten. Do not supplement your present diet with high-fiber foods, but replace foods in your present diet.    INCLUDE A VARIETY OF FIBER SOURCES  Replace refined and processed grains with whole grains, canned fruits with fresh fruits, and incorporate other fiber sources. White rice, white breads, and most bakery goods contain little or no fiber.   Brown whole-grain rice, buckwheat oats, and many fruits and vegetables are all good sources of fiber. These include: broccoli, Brussels sprouts, cabbage, cauliflower, beets, sweet potatoes, white potatoes (skin on), carrots, tomatoes, eggplant, squash, berries, fresh fruits, and dried fruits.   Cereals appear to be the richest source of fiber. Cereal fiber is found in whole grains and bran. Bran is the fiber-rich outer coat of cereal grain, which is largely removed in refining. In whole-grain cereals, the bran remains. In breakfast cereals, the largest amount of fiber is found in those with "bran" in their names. The fiber content is sometimes indicated on the label.   You may need to include additional fruits and vegetables each day.   In baking, for 1 cup  white flour, you may use the following substitutions:   1 cup whole-wheat flour minus 2 tablespoons.   1/2 cup white flour plus 1/2 cup whole-wheat flour.

## 2012-06-28 NOTE — Progress Notes (Signed)
SYMPTOMS: RECTAL BLEEDING, PRESSURE, PAIN, ITCHING, BURNING. SOLID DYSPHAGIA AND UNCONTROLLED REFLUX. NISSEN FEB 2013.  CONSTIPATION: NO DIARRHEA: YES  STRAINS WITH BMs: YES  TIME SPENT ON TOILET: 5-10 MINS TISSUE POKES OUT OF RECTUM: YES- GOES BACK BY ITSELF FIBER SUPPLEMENTS: YES-EQUATE BID  GLASSES OF WATER/DAY 6-8: NO   ADDITIONAL QUESTIONS:  LATEX ALLERGY: NO PREGNANT: NO ERECTILE DYSFUNCTION MEDS OR NITRATES: NO ANTICOAGULATION/ANTIPLATELET MEDS: NO DIAGNOSED WITH CROHN'S DISEASE, PROCTITIS, PORTAL HTN, OR ANAL/RECTAL CA: NO TAKING IMMUNOSUPPRESSANTS/XRT: NO   PLAN: 1. [facility] BAND PLACEMENT 2. EGD/DIL WITH PROPOFOL DUE TO POLYPHARMACY. NEED TO RE-ASSESS NISSEN WRAP

## 2012-07-05 ENCOUNTER — Ambulatory Visit: Payer: Self-pay | Admitting: Pain Medicine

## 2012-07-05 LAB — BASIC METABOLIC PANEL
Anion Gap: 5 — ABNORMAL LOW (ref 7–16)
BUN: 7 mg/dL (ref 7–18)
Calcium, Total: 9.1 mg/dL (ref 8.5–10.1)
Chloride: 108 mmol/L — ABNORMAL HIGH (ref 98–107)
Co2: 30 mmol/L (ref 21–32)
Creatinine: 0.95 mg/dL (ref 0.60–1.30)
EGFR (African American): >60
EGFR (Non-African Amer.): >60
Glucose: 90 mg/dL (ref 65–99)
Osmolality: 282 (ref 275–301)
Potassium: 3.9 mmol/L (ref 3.5–5.1)
Sodium: 143 mmol/L (ref 136–145)

## 2012-07-09 ENCOUNTER — Telehealth: Payer: Self-pay

## 2012-07-09 ENCOUNTER — Encounter (HOSPITAL_COMMUNITY): Payer: Self-pay

## 2012-07-09 ENCOUNTER — Encounter (HOSPITAL_COMMUNITY)
Admission: RE | Admit: 2012-07-09 | Discharge: 2012-07-09 | Disposition: A | Discharge status: Home / Self Care | Payer: Medicare HMO | Source: Ambulatory Visit | Attending: Gastroenterology | Admitting: Gastroenterology

## 2012-07-09 LAB — BASIC METABOLIC PANEL
BUN: 8 mg/dL (ref 6–23)
Calcium: 9.3 mg/dL (ref 8.4–10.5)
Creatinine, Ser: 0.88 mg/dL (ref 0.50–1.35)
GFR calc non Af Amer: >90 mL/min (ref >90)
Glucose, Bld: 92 mg/dL (ref 70–99)

## 2012-07-09 NOTE — Telephone Encounter (Signed)
Called and informed pt.  

## 2012-07-09 NOTE — Telephone Encounter (Signed)
Pt called and said  Nurse from [facility] came to see him at home recently. Did a health exam. Found an oral pus sac on one side of his throat and a crater on the other side. He told them at the hospital when he went for his pre-op and they told him to let Dr. Darrick Penna know. His EGD/ED is scheduled for 07/16/2012 with Dr. Darrick Penna. I told him I will let her know.

## 2012-07-09 NOTE — Telephone Encounter (Signed)
PLEASE CALL PT.  He should see his PCP. IT WON;T PREVENT HIM FROM HAVING AN EGD.

## 2012-07-09 NOTE — Patient Instructions (Addendum)
Norman Zamora [facility]  07/09/2012   Your procedure is scheduled on:   07/16/2012  Report to [facility] at  715  AM.  Call this number if you have problems the morning of surgery: [phone]   Remember:   Do not eat food or drink liquids after midnight.   Take these medicines the morning of surgery with A SIP OF WATER:  Klonopin,depakote, neurontin, protonix, effexor   Do not wear jewelry, make-up or nail polish.  Do not wear lotions, powders, or perfumes.   Do not shave 48 hours prior to surgery. Men may shave face and neck.  Do not bring valuables to the hospital.  [facility] is not responsible for any belongings or valuables.  Contacts, dentures or bridgework may not be worn into surgery.  Leave suitcase in the car. After surgery it may be brought to your room.  For patients admitted to the hospital, checkout time is 11:00 AM the day of discharge.   Patients discharged the day of surgery will not be allowed to drive  home.  Name and phone number of your driver: family  Special Instructions: N/A   Please read over the following fact sheets that you were given: Pain Booklet, Coughing and Deep Breathing, Surgical Site Infection Prevention, Anesthesia Post-op Instructions and Care and Recovery After Surgery Esophageal Dilatation The esophagus is the long, narrow tube which carries food and liquid from the mouth to the stomach. Esophageal dilatation is the technique used to stretch a blocked or narrowed portion of the esophagus. This procedure is used when a part of the esophagus has become so narrow that it becomes difficult, painful or even impossible to swallow. This is generally an uncomplicated form of treatment. When this is not successful, chest surgery may be required. This is a much more extensive form of treatment with a longer recovery time. CAUSES  Some of the more common causes of blockage or strictures of the esophagus are:  Narrowing from longstanding inflammation  (soreness and redness) of the lower esophagus. This comes from the constant exposure of the lower esophagus to the acid which bubbles up from the stomach. Over time this causes scarring and narrowing of the lower esophagus.  Hiatal hernia in which a small part of the stomach bulges (herniates) up through the diaphragm. This can cause a gradual narrowing of the end of the esophagus.  Schatzki's Ring is a narrow ring of benign (non-cancerous) fibrous tissue which constricts the lower esophagus. The reason for this is not known.  Scleroderma is a connective tissue disorder that affects the esophagus and makes swallowing difficult.  Achalasia is an absence of nerves to the lower esophagus and to the esophageal sphincter. This is the circular muscle between the stomach and esophagus that relaxes to allow food into the stomach. After swallowing, it contracts to keep food in the stomach. This absence of nerves may be congenital (present since birth). This can cause irregular spasms of the lower esophageal muscle. This spasm does not open up to allow food and fluid through. The result is a persistent blockage with subsequent slow trickling of the esophageal contents into the stomach.  Strictures may develop from swallowing materials which damage the esophagus. Some examples are strong acids or alkalis such as lye.  Growths such as benign (non-cancerous) and malignant (cancerous) tumors can block the esophagus.  Heredity (present since birth) causes. DIAGNOSIS  Your caregiver often suspects this problem by taking a medical history. They  will also do a physical exam. They can then prove their suspicions using X-rays and endoscopy. Endoscopy is an exam in which a tube like a small flexible telescope is used to look at your esophagus.  TREATMENT There are different stretching (dilating) techniques which can be used. Simple bougie dilatation may be done in the office. This usually takes only a couple minutes. A  numbing (anesthetic) spray of the throat is used. Endoscopy, when done, is done in an endoscopy suite, under mild sedation. When fluoroscopy is used, the procedure is performed in X-ray. Other techniques require a little longer time. Recovery is usually quick. There is no waiting time to begin eating and drinking to test success of the treatment. Following are some of the methods used. Narrowing of the esophagus is treated by making it bigger. Commonly this is a mechanical problem which can be treated with stretching. This can be done in different ways. Your caregiver will discuss these with you. Some of the means used are:  A series of graduated (increasing thickness) flexible dilators can be used. These are weighted tubes passed through the esophagus into the stomach. The tubes used become progressively larger until the desired stretched size is reached. Graduated dilators are a simple and quick way of opening the esophagus. No visualization is required.  Another method is the use of endoscopy to place a flexible wire across the stricture. The endoscope is removed and the wire left in place. A dilator with a hole through it from end to end is guided down the esophagus and across the stricture. One or more of these dilators are passed over the wire. At the end of the exam, the wire is removed. This type of treatment may be performed in the X-ray department under fluoroscopy. An advantage of this procedure is the examiner is visualizing the end opening in the esophagus.  Stretching of the esophagus may be done using balloons. Deflated balloons are placed through the endoscope and across the stricture. This type of balloon dilatation is often done at the time of endoscopy or fluoroscopy. Flexible endoscopy allows the examiner to directly view the stricture. A balloon is inserted in the deflated form into the area of narrowing. It is then inflated with air to a certain pressure that is pre-set for a given  circumference. When inflated, it becomes sausage shaped, stretched, and makes the stricture larger.  Achalasia requires a longer larger balloon-type dilator. This is frequently done under X-ray control. In this situation, the spastic muscle fibers in the lower esophagus are stretched. All of the above procedures make the passage of food and water into the stomach easier. They also make it easier for stomach contents to reflux back into the esophagus. Special medications may be used following the procedure to help prevent further stricturing. Proton-pump inhibitor medications are good at decreasing the amount of acid in the stomach juice. When stomach juice refluxes into the esophagus, the juice is no longer as acidic and is less likely to burn or scar the esophagus. RISKS AND COMPLICATIONS Esophageal dilatation is usually performed effectively and without problems. Some complications that can occur are:  A small amount of bleeding almost always happens where the stretching takes place. If this is too excessive it may require more aggressive treatment.  An uncommon complication is perforation (making a hole) of the esophagus. The esophagus is thin. It is easy to make a hole in it. If this happens, an operation may be necessary to repair this.  A  small, undetected perforation could lead to an infection in the chest. This can be very serious. HOME CARE INSTRUCTIONS   If you received sedation for your procedure, do not drive, make important decisions, or perform any activities requiring your full coordination. Do not drink alcohol, take sedatives, or use any mind altering chemicals unless instructed by your caregiver.  You may use throat lozenges or warm salt water gargles if you have throat discomfort  You can begin eating and drinking normally on return home unless instructed otherwise. Do not purposely try to force large chunks of food down to test the benefits of your procedure.  Mild discomfort  can be eased with sips of ice water.  Medications for discomfort may or may not be needed. SEEK IMMEDIATE MEDICAL CARE IF:   You begin vomiting up blood.  You develop black tarry stools  You develop chills or an unexplained temperature of over 101 F (38.3 C)  You develop chest or abdominal pain.  You develop shortness of breath or feel lightheaded or faint.  Your swallowing is becoming more painful, difficult, or you are unable to swallow. MAKE SURE YOU:   Understand these instructions.  Will watch your condition.  Will get help right away if you are not doing well or get worse. Document Released: 02/23/2005 Document Revised: 03/27/2011 Document Reviewed: 04/12/2005 [facility] Patient Information 2014 [place], [place]. Esophagogastroduodenoscopy Esophagogastroduodenoscopy (EGD) is a procedure to examine the lining of the esophagus, stomach, and first part of the small intestine (duodenum). A long, flexible, lighted tube with a camera attached (endoscope) is inserted down the throat to view these organs. This procedure is done to detect problems or abnormalities, such as inflammation, bleeding, ulcers, or growths, in order to treat them. The procedure lasts about 5 20 minutes. It is usually an outpatient procedure, but it may need to be performed in emergency cases in the hospital. LET YOUR CAREGIVER KNOW ABOUT:   Allergies to food or medicine.  All medicines you are taking, including vitamins, herbs, eyedrops, and over-the-counter medicines and creams.  Use of steroids (by mouth or creams).  Previous problems you or members of your family have had with the use of anesthetics.  Any blood disorders you have.  Previous surgeries you have had.  Other health problems you have.  Possibility of pregnancy, if this applies. RISKS AND COMPLICATIONS  Generally, EGD is a safe procedure. However, as with any procedure, complications can occur. Possible complications  include:  Infection.  Bleeding.  Tearing (perforation) of the esophagus, stomach, or duodenum.  Difficulty breathing or not being able to breath.  Excessive sweating.  Spasms of the larynx.  Slowed heartbeat.  Low blood pressure. BEFORE THE PROCEDURE  Do not eat or drink anything for 6 8 hours before the procedure or as directed by your caregiver.  Ask your caregiver about changing or stopping your regular medicines.  If you wear dentures, be prepared to remove them before the procedure.  Arrange for someone to drive you home after the procedure. PROCEDURE   A vein will be accessed to give medicines and fluids. A medicine to relax you (sedative) and a pain reliever will be given through that access into the vein.  A numbing medicine (local anesthetic) may be sprayed on your throat for comfort and to stop you from gagging or coughing.  A mouth guard may be placed in your mouth to protect your teeth and to keep you from biting on the endoscope.  You will be asked to  lie on your left side.  The endoscope is inserted down your throat and into the esophagus, stomach, and duodenum.  Air is put through the endoscope to allow your caregiver to view the lining of your esophagus clearly.  The esophagus, stomach, and duodenum is then examined. During the exam, your caregiver may:  Remove tissue to be examined under a microscope (biopsy) for inflammation, infection, or other medical problems.  Remove growths.  Remove objects (foreign bodies) that are stuck.  Treat any bleeding with medicines or other devices that stop tissues from bleeding (hot cauters, clipping devices).  Widen (dilate) or stretch narrowed areas of the esophagus and stomach.  The endoscope will then be withdrawn. AFTER THE PROCEDURE  You will be taken to a recovery area to be monitored. You will be able to go home once you are stable and alert.  Do not eat or drink anything until the local anesthetic and  numbing medicines have worn off. You may choke.  It is normal to feel bloated, have pain with swallowing, or have a sore throat for a short time. This will wear off.  Your caregiver should be able to discuss his or her findings with you. It will take longer to discuss the test results if any biopsies were taken. Document Released: 05/05/2004 Document Revised: 12/20/2011 Document Reviewed: 12/06/2011 [facility] Patient Information 2014 [place], [place]. PATIENT INSTRUCTIONS POST-ANESTHESIA  IMMEDIATELY FOLLOWING SURGERY:  Do not drive or operate machinery for the first twenty four hours after surgery.  Do not make any important decisions for twenty four hours after surgery or while taking narcotic pain medications or sedatives.  If you develop intractable nausea and vomiting or a severe headache please notify your doctor immediately.  FOLLOW-UP:  Please make an appointment with your surgeon as instructed. You do not need to follow up with anesthesia unless specifically instructed to do so.  WOUND CARE INSTRUCTIONS (if applicable):  Keep a dry clean dressing on the anesthesia/puncture wound site if there is drainage.  Once the wound has quit draining you may leave it open to air.  Generally you should leave the bandage intact for twenty four hours unless there is drainage.  If the epidural site drains for more than 36-48 hours please call the anesthesia department.  QUESTIONS?:  Please feel free to call your physician or the hospital operator if you have any questions, and they will be happy to assist you.

## 2012-07-10 ENCOUNTER — Telehealth: Payer: Self-pay | Admitting: Gastroenterology

## 2012-07-10 NOTE — Telephone Encounter (Signed)
Pt is aware of results. 

## 2012-07-10 NOTE — Telephone Encounter (Signed)
PLEASE CALL PT. HIS LABS ARE NORMAL. 

## 2012-07-16 ENCOUNTER — Encounter (HOSPITAL_COMMUNITY): Payer: Self-pay | Admitting: Anesthesiology

## 2012-07-16 ENCOUNTER — Encounter (HOSPITAL_COMMUNITY)
Admission: RE | Disposition: A | Discharge status: Home / Self Care | Payer: Self-pay | Source: Ambulatory Visit | Attending: Gastroenterology

## 2012-07-16 ENCOUNTER — Ambulatory Visit (HOSPITAL_COMMUNITY): Payer: Medicare HMO | Admitting: Anesthesiology

## 2012-07-16 ENCOUNTER — Ambulatory Visit (HOSPITAL_COMMUNITY)
Admission: RE | Admit: 2012-07-16 | Discharge: 2012-07-16 | Disposition: A | Discharge status: Home / Self Care | Payer: Medicare HMO | Source: Ambulatory Visit | Attending: Gastroenterology | Admitting: Gastroenterology

## 2012-07-16 DIAGNOSIS — Z9884 Bariatric surgery status: Secondary | ICD-10-CM | POA: Insufficient documentation

## 2012-07-16 DIAGNOSIS — F3289 Other specified depressive episodes: Secondary | ICD-10-CM | POA: Insufficient documentation

## 2012-07-16 DIAGNOSIS — B009 Herpesviral infection, unspecified: Secondary | ICD-10-CM | POA: Insufficient documentation

## 2012-07-16 DIAGNOSIS — K589 Irritable bowel syndrome without diarrhea: Secondary | ICD-10-CM | POA: Insufficient documentation

## 2012-07-16 DIAGNOSIS — K7689 Other specified diseases of liver: Secondary | ICD-10-CM | POA: Insufficient documentation

## 2012-07-16 DIAGNOSIS — F17200 Nicotine dependence, unspecified, uncomplicated: Secondary | ICD-10-CM | POA: Insufficient documentation

## 2012-07-16 DIAGNOSIS — F411 Generalized anxiety disorder: Secondary | ICD-10-CM | POA: Insufficient documentation

## 2012-07-16 DIAGNOSIS — K229 Disease of esophagus, unspecified: Secondary | ICD-10-CM | POA: Insufficient documentation

## 2012-07-16 DIAGNOSIS — Z79899 Other long term (current) drug therapy: Secondary | ICD-10-CM | POA: Insufficient documentation

## 2012-07-16 DIAGNOSIS — M62838 Other muscle spasm: Secondary | ICD-10-CM | POA: Insufficient documentation

## 2012-07-16 DIAGNOSIS — F329 Major depressive disorder, single episode, unspecified: Secondary | ICD-10-CM | POA: Insufficient documentation

## 2012-07-16 DIAGNOSIS — R197 Diarrhea, unspecified: Secondary | ICD-10-CM | POA: Insufficient documentation

## 2012-07-16 DIAGNOSIS — F4310 Post-traumatic stress disorder, unspecified: Secondary | ICD-10-CM | POA: Insufficient documentation

## 2012-07-16 DIAGNOSIS — K2970 Gastritis, unspecified, without bleeding: Secondary | ICD-10-CM

## 2012-07-16 DIAGNOSIS — K2940 Chronic atrophic gastritis without bleeding: Secondary | ICD-10-CM | POA: Insufficient documentation

## 2012-07-16 DIAGNOSIS — K219 Gastro-esophageal reflux disease without esophagitis: Secondary | ICD-10-CM

## 2012-07-16 DIAGNOSIS — G4730 Sleep apnea, unspecified: Secondary | ICD-10-CM | POA: Insufficient documentation

## 2012-07-16 DIAGNOSIS — K2990 Gastroduodenitis, unspecified, without bleeding: Secondary | ICD-10-CM

## 2012-07-16 DIAGNOSIS — R1310 Dysphagia, unspecified: Secondary | ICD-10-CM

## 2012-07-16 HISTORY — PX: BRAVO PH STUDY: SHX5421

## 2012-07-16 HISTORY — PX: ESOPHAGOGASTRODUODENOSCOPY (EGD) WITH PROPOFOL: SHX5813

## 2012-07-16 HISTORY — PX: SAVORY DILATION: SHX5439

## 2012-07-16 HISTORY — PX: BIOPSY: SHX5522

## 2012-07-16 LAB — GLUCOSE, CAPILLARY: Glucose-Capillary: 90 mg/dL (ref 70–99)

## 2012-07-16 SURGERY — ESOPHAGOGASTRODUODENOSCOPY (EGD) WITH PROPOFOL
Anesthesia: Monitor Anesthesia Care

## 2012-07-16 MED ORDER — LACTATED RINGERS IV SOLN
INTRAVENOUS | Status: DC
  Administered 2012-07-16: 1000 mL via INTRAVENOUS

## 2012-07-16 MED ORDER — ONDANSETRON HCL 4 MG/2ML IJ SOLN
INTRAMUSCULAR | Status: AC
  Filled 2012-07-16: qty 2

## 2012-07-16 MED ORDER — BUTAMBEN-TETRACAINE-BENZOCAINE 2-2-14 % EX AERO
1.0000 | INHALATION_SPRAY | Freq: Once | CUTANEOUS | Status: AC
  Administered 2012-07-16: 1 via TOPICAL
  Filled 2012-07-16: qty 56

## 2012-07-16 MED ORDER — MINERAL OIL PO OIL
TOPICAL_OIL | ORAL | Status: AC
  Filled 2012-07-16: qty 30

## 2012-07-16 MED ORDER — FENTANYL CITRATE 0.05 MG/ML IJ SOLN
INTRAMUSCULAR | Status: AC
  Filled 2012-07-16: qty 2

## 2012-07-16 MED ORDER — FENTANYL CITRATE 0.05 MG/ML IJ SOLN: 25.0000 ug | INTRAMUSCULAR | Status: DC | PRN

## 2012-07-16 MED ORDER — MIDAZOLAM HCL 5 MG/5ML IJ SOLN
INTRAMUSCULAR | Status: DC | PRN
  Administered 2012-07-16 (×2): 1 mg via INTRAVENOUS

## 2012-07-16 MED ORDER — MINERAL OIL LIGHT 100 % EX OIL
TOPICAL_OIL | CUTANEOUS | Status: DC | PRN
  Administered 2012-07-16: 1 via TOPICAL

## 2012-07-16 MED ORDER — FENTANYL CITRATE 0.05 MG/ML IJ SOLN
25.0000 ug | INTRAMUSCULAR | Status: DC | PRN
  Administered 2012-07-16 (×2): 25 ug via INTRAVENOUS

## 2012-07-16 MED ORDER — MIDAZOLAM HCL 2 MG/2ML IJ SOLN
1.0000 mg | INTRAMUSCULAR | Status: DC | PRN
  Administered 2012-07-16: 2 mg via INTRAVENOUS

## 2012-07-16 MED ORDER — PROPOFOL INFUSION 10 MG/ML OPTIME
INTRAVENOUS | Status: DC | PRN
  Administered 2012-07-16: 85 ug/kg/min via INTRAVENOUS
  Administered 2012-07-16: 100 ug/kg/min via INTRAVENOUS

## 2012-07-16 MED ORDER — MIDAZOLAM HCL 2 MG/2ML IJ SOLN
INTRAMUSCULAR | Status: AC
  Filled 2012-07-16: qty 2

## 2012-07-16 MED ORDER — STERILE WATER FOR IRRIGATION IR SOLN
Status: DC | PRN
  Administered 2012-07-16: 09:00:00

## 2012-07-16 MED ORDER — ONDANSETRON HCL 4 MG/2ML IJ SOLN
4.0000 mg | Freq: Once | INTRAMUSCULAR | Status: AC
  Administered 2012-07-16: 4 mg via INTRAVENOUS

## 2012-07-16 MED ORDER — PROPOFOL 10 MG/ML IV EMUL
INTRAVENOUS | Status: AC
  Filled 2012-07-16: qty 20

## 2012-07-16 MED ORDER — FENTANYL CITRATE 0.05 MG/ML IJ SOLN
INTRAMUSCULAR | Status: DC | PRN
  Administered 2012-07-16 (×2): 25 ug via INTRAVENOUS

## 2012-07-16 MED ORDER — LACTATED RINGERS IV SOLN
INTRAVENOUS | Status: DC | PRN
  Administered 2012-07-16: 07:00:00 via INTRAVENOUS

## 2012-07-16 MED ORDER — LIDOCAINE HCL (PF) 1 % IJ SOLN
INTRAMUSCULAR | Status: AC
  Filled 2012-07-16: qty 5

## 2012-07-16 MED ORDER — ONDANSETRON HCL 4 MG/2ML IJ SOLN: 4.0000 mg | Freq: Once | INTRAMUSCULAR | Status: DC | PRN

## 2012-07-16 SURGICAL SUPPLY — 11 items
BLOCK BITE 60FR ADLT L/F BLUE (MISCELLANEOUS) ×2 IMPLANT
CAPSULE BRAVO RADIO TELEMETRY (MISCELLANEOUS) ×2 IMPLANT
FLOOR PAD 36X40 (MISCELLANEOUS) ×2
FORCEPS BIOP RAD 4 LRG CAP 4 (CUTTING FORCEPS) ×2 IMPLANT
FORMALIN 10 PREFIL 20ML (MISCELLANEOUS) ×2 IMPLANT
MANIFOLD NEPTUNE II (INSTRUMENTS) ×2 IMPLANT
PAD FLOOR 36X40 (MISCELLANEOUS) ×1 IMPLANT
SYR 50ML LL SCALE MARK (SYRINGE) ×2 IMPLANT
TUBING ENDO SMARTCAP PENTAX (MISCELLANEOUS) ×2 IMPLANT
TUBING IRRIGATION ENDOGATOR (MISCELLANEOUS) ×2 IMPLANT
WATER STERILE IRR 1000ML POUR (IV SOLUTION) ×2 IMPLANT

## 2012-07-16 NOTE — Anesthesia Preprocedure Evaluation (Signed)
Anesthesia Evaluation  Patient identified by MRN, date of birth, ID band Patient awake    Reviewed: Allergy & Precautions, H&P , NPO status , Patient's Chart, lab work & pertinent test results  History of Anesthesia Complications Negative for: history of anesthetic complications  Airway Mallampati: I  Neck ROM: Limited    Dental  (+) Teeth Intact   Pulmonary sleep apnea and Continuous Positive Airway Pressure Ventilation ,  breath sounds clear to auscultation        Cardiovascular Rhythm:Regular Rate:Normal     Neuro/Psych  Headaches, PSYCHIATRIC DISORDERS (hx PTSD) Anxiety Depression    GI/Hepatic GERD- (IBS, Dysphagia)  Medicated and Controlled,  Endo/Other    Renal/GU      Musculoskeletal   Abdominal   Peds  Hematology   Anesthesia Other Findings   Reproductive/Obstetrics                           Anesthesia Physical Anesthesia Plan  ASA: III  Anesthesia Plan: MAC   Post-op Pain Management:    Induction: Intravenous  Airway Management Planned: Simple Face Mask  Additional Equipment:   Intra-op Plan:   Post-operative Plan:   Informed Consent: I have reviewed the patients History and Physical, chart, labs and discussed the procedure including the risks, benefits and alternatives for the proposed anesthesia with the patient or authorized representative who has indicated his/her understanding and acceptance.     Plan Discussed with:   Anesthesia Plan Comments:         Anesthesia Quick Evaluation

## 2012-07-16 NOTE — Transfer of Care (Signed)
Immediate Anesthesia Transfer of Care Note  Patient: Norman Zamora  Procedure(s) Performed: Procedure(s): ESOPHAGOGASTRODUODENOSCOPY (EGD) WITH PROPOFOL (Gastroesophageal junction 45cm from teeth) (N/A) SAVORY DILATION (N/A) BRAVO PH STUDY; Catheter (lot [id number] expires 12/16/2012) placed 39cm from teeth; study began 0933 (N/A) GASTRIC BIOPSIES  Patient Location: PACU  Anesthesia Type:MAC  Level of Consciousness: awake, alert , oriented and patient cooperative  Airway & Oxygen Therapy: Patient Spontanous Breathing  Post-op Assessment: Report given to PACU RN, Post -op Vital signs reviewed and stable and Patient moving all extremities  Post vital signs: Reviewed and stable  Complications: No apparent anesthesia complications

## 2012-07-16 NOTE — H&P (Signed)
Primary Care Physician:  Ernestine Conrad, MD Primary Gastroenterologist:  Dr. Darrick Penna  Pre-Procedure History & Physical: HPI:  Norman Zamora is a 42 y.o. male here for UNCONTROLLED GERD/NSSSEN 2013.  Past Medical History  Diagnosis Date  . IBS (irritable bowel syndrome) 2009 diarrhea predominant  . Hemorrhoids, internal, with bleeding 2011  . Fatty liver 2011 184 lbs  . HA (headache)   . Chronic pain NECK  . Genital herpes   . GERD (gastroesophageal reflux disease)     BRAVO 2013 with uncontrolled GERD, referred for Nissen, completed by Dr. Lovell Sheehan  . Anxiety   . Depression   . PTSD (post-traumatic stress disorder) FROM CHILDHOOD  . N&V (nausea and vomiting)   . Chronic diarrhea   . Muscle tremor   . Pneumothorax on left   . Sleep apnea     CPAP    Past Surgical History  Procedure Laterality Date  . Implantation / placement epidural neurostimulator electrodes  1995  . Upper gastrointestinal endoscopy  AUG 2011-diarrhea, dysphagia    NSAID GASTRITIS, NL DUODENUM, ESO DIL  16 MM  . Esophagogastroduodenoscopy  06/2010    distal esophageal web and ring dilated, patchy antral erythema bx showed mild chronic gastritis and no H.Pylori, distal esophageal bx was negative  . Colonoscopy  06/2010    frequent diverticula transverse colon to sigmoid colon, small internal hemorrhoid, slightly tortuous, random bx were negative. Next TCS 06/2020  . Neck surgery    . Tonsillectomy    . 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 Left lung.  . Wisdom tooth extraction    . Back surgery  2004, 2005    cervical disc fusion c3-4 and c6-7  . Bravo ph study  01/30/2011    Procedure: BRAVO PH STUDY;  Surgeon: Arlyce Harman, MD;  Location: [facility];  Service: Endoscopy;  Laterality: N/A;  Lot# [id number] Exp: 07/17/2011  . Esophageal biopsy  01/30/2011    Moderate gastritis MOST LIKELY 2o to MOBIC/ DYSPHAGIA MOST LIKeLY 2o TO UNCONTROLLED GERD, less likely eso motility disorder   . Laparoscopic nissen fundoplication  03/08/2011    Procedure: LAPAROSCOPIC NISSEN FUNDOPLICATION;  Surgeon: Dalia Heading, MD;  Location: [facility];  Service: General;  Laterality: N/A;    Prior to Admission medications   Medication Sig Start Date End Date Taking? Authorizing Provider  bifidobacterium infantis (ALIGN) capsule Take 1 capsule by mouth daily. 11/30/11  Yes West Bali, MD  clonazePAM (KLONOPIN) 1 MG tablet Take 0.5 mg by mouth 3 (three) times daily.    Yes Historical Provider, MD  divalproex (DEPAKOTE) 500 MG EC tablet Take 500 mg by mouth 2 (two) times daily.    Yes Historical Provider, MD  gabapentin (NEURONTIN) 400 MG capsule Take 400 mg by mouth 3 (three) times daily.   Yes Historical Provider, MD  loperamide (LOPERAMIDE A-D) 2 MG tablet 1-2 PO QAC AND HS. NO MORE THAN 8 PILLS A DAY 03/28/12  Yes West Bali, MD  mirtazapine (REMERON) 30 MG tablet Take 30 mg by mouth at bedtime.   Yes Historical Provider, MD  nitroGLYCERIN (NITROGLYN) 2 % ointment Place 0.5 inches onto the skin 3 (three) times daily. In anal canal   Yes Historical Provider, MD  pantoprazole (PROTONIX) 40 MG tablet 1 PO 30 MINUTES PRIOR TO MEALS BID 03/28/12  Yes West Bali, MD  venlafaxine (EFFEXOR) 75 MG tablet Take 75 mg by mouth 2 (two) times daily.   Yes  Historical Provider, MD    Allergies as of 06/27/2012  . (No Known Allergies)    Family History  Problem Relation Age of Onset  . Colon cancer Cousin     MATERNAL, 30s  . Throat cancer Maternal Grandfather   . Anesthesia problems Neg Hx   . Hypotension Neg Hx   . Malignant hyperthermia Neg Hx   . Pseudochol deficiency Neg Hx     History   Social History  . Marital Status: Married    Spouse Name: N/A    Number of Children: N/A  . Years of Education: N/A   Occupational History  . Not on file.   Social History Main Topics  . Smoking status: Current Every Day Smoker -- 1.00 packs/day for 30 years    Types: Cigarettes  .  Smokeless tobacco: Not on file  . Alcohol Use: No  . Drug Use: No  . Sexually Active: Yes    Birth Control/ Protection: None   Other Topics Concern  . Not on file   Social History Narrative  . No narrative on file    Review of Systems: See HPI, otherwise negative ROS   Physical Exam: BP 130/85  Pulse 75  Temp(Src) 98.1 F (36.7 C) (Oral)  Resp 22  Ht 6\' 3"  (1.905 m)  Wt 210 lb (95.255 kg)  BMI 26.25 kg/m2  SpO2 94% General:   Alert,  pleasant and cooperative in NAD Head:  Normocephalic and atraumatic. Neck:  Supple; Lungs:  Clear throughout to auscultation.    Heart:  Regular rate and rhythm. Abdomen:  Soft, nontender and nondistended. Normal bowel sounds, without guarding, and without rebound.   Neurologic:  Alert and  oriented x4;  grossly normal neurologically.  Impression/Plan:    UNCONTROLLED GERD/NSSSEN 2013  PLAN: EGD TODAY WITH BRAVO PLACEMENT

## 2012-07-16 NOTE — Anesthesia Procedure Notes (Signed)
Procedure Name: MAC Performed by: Carolyne Littles, Olga Bourbeau L Pre-anesthesia Checklist: Patient identified, Timeout performed, Emergency Drugs available, Suction available and Patient being monitored Oxygen Delivery Method: Non-rebreather mask

## 2012-07-16 NOTE — Preoperative (Signed)
Beta Blockers   Reason not to administer Beta Blockers:Not Applicable 

## 2012-07-16 NOTE — Anesthesia Postprocedure Evaluation (Signed)
  Anesthesia Post-op Note  Patient: Norman Zamora  Procedure(s) Performed: Procedure(s): ESOPHAGOGASTRODUODENOSCOPY (EGD) WITH PROPOFOL (Gastroesophageal junction 45cm from teeth) (N/A) SAVORY DILATION (N/A) BRAVO PH STUDY; Catheter (lot [id number] expires 12/16/2012) placed 39cm from teeth; study began 0933 (N/A) GASTRIC BIOPSIES  Patient Location: PACU  Anesthesia Type:MAC  Level of Consciousness: awake, alert , oriented and patient cooperative  Airway and Oxygen Therapy: Patient Spontanous Breathing  Post-op Pain: Mild   Post-op Assessment: Post-op Vital signs reviewed, Patient's Cardiovascular Status Stable, Respiratory Function Stable, Patent Airway, No signs of Nausea or vomiting and Pain level controlled  Post-op Vital Signs: Reviewed and stable  Complications: No apparent anesthesia complications

## 2012-07-16 NOTE — Op Note (Signed)
[facility] [address] [place], [address]   ENDOSCOPY PROCEDURE REPORT  PATIENT: Norman Zamora, Norman Zamora  MR#: [id number] BIRTHDATE: 28-Aug-1970 , 42  yrs. old GENDER: Male  ENDOSCOPIST: Jonette Eva, MD REFFERED XB:JYNW Loney Hering, M.D.  PROCEDURE DATE:  07/16/2012 PROCEDURE:   EGD with biopsy and EGD with dilatation over guidewire   INDICATIONS:1.  uncontrolled reflux and dyaphgia-PSHx: BRAVO/ STUDY/NISSEN FUNDOPLICATION IN FEB 2013. MEDICATIONS: MAC sedation, administered by CRNA TOPICAL ANESTHETIC: Cetacaine Spray  DESCRIPTION OF PROCEDURE:   After the risks benefits and alternatives of the procedure were thoroughly explained, informed consent was obtained.  The     endoscope was introduced through the mouth and advanced to the second portion of the duodenum. The instrument was slowly withdrawn as the mucosa was carefully examined.  Prior to withdrawal of the scope, the guidwire was placed.  The esophagus was dilated successfully.  The patient was recovered in endoscopy and discharged home in satisfactory condition.   EOPHAGUS.: NORMAL ESOPHAGEAL MUCOAS. GE JXN 45 CM FROM THE TEETH. BRAVO CAPSULE PLACED 39 CM FROM THE TEETH. STOMACH: NISSEN WRAP INTACT.   Mild non-erosive gastritis (inflammation) was found in the gastric antrum.  Multiple biopsies were performed using cold forceps.   DUODENUM: The duodenal mucosa showed no abnormalities in the bulb and second portion of the duodenum.   Dilation was then performed at the proximal esophagus Dilator: Savary over guidewire Size(s): 16 MM Resistance: minimal  COMPLICATIONS: There were no complications.  ENDOSCOPIC IMPRESSION: 1.   NISSEN WRAP INTACT.  SX OF "UNCONTROLLED REFLUX" MOST LIKELY DUE TO NON-ULCER DYSPEPSIA. 2.   POSSIBLE PROXIMAL ESOPHAGEAL WEB, S/P DILATION 3.  MILD Non-erosive gastritis  RECOMMENDATIONS: CONTINUE PROTONIX.  TAKE 30 MINUTES PRIOR TO MEALS TWICE DAILY. RECORD ALL PO INTAKE FOR  THE NEXT 48 HOURS. RETURN THE RECORDER ON THURSDAY. FOLLOW A LOW FAT DIET. BIOPSY RESULTS SHOULD BE BACK IN 7 DAYS. FOLLOW UP IN 3 MOS. CONSIDER BPE IF DYSPHAGIA CONTINUES.      _______________________________ Rosalie DoctorJonette Eva, MD 07/16/2012 2:56 PM      PATIENT NAME:  [name], [name] MR#: [id number]

## 2012-07-17 ENCOUNTER — Encounter (HOSPITAL_COMMUNITY): Payer: Self-pay | Admitting: Gastroenterology

## 2012-07-17 MED ORDER — LIDOCAINE HCL (CARDIAC) 10 MG/ML IV SOLN
INTRAVENOUS | Status: DC | PRN
  Administered 2012-07-16: 50 mg via INTRAVENOUS

## 2012-07-17 NOTE — Addendum Note (Signed)
Addendum created 07/17/12 0940 by Marolyn Hammock, CRNA   Modules edited: Anesthesia Medication Administration

## 2012-07-18 DIAGNOSIS — K3189 Other diseases of stomach and duodenum: Secondary | ICD-10-CM

## 2012-07-18 DIAGNOSIS — R1013 Epigastric pain: Secondary | ICD-10-CM

## 2012-07-18 DIAGNOSIS — R1310 Dysphagia, unspecified: Secondary | ICD-10-CM

## 2012-07-24 ENCOUNTER — Telehealth: Payer: Self-pay | Admitting: Gastroenterology

## 2012-07-24 NOTE — Telephone Encounter (Addendum)
Please call pt. His stomach Bx shows gastritis. HIS BRAVO STUDY SHOWS HIS PROTONIX SHUTS DOWN HIS ACID PUMPS.   CONTINUE PROTONIX. TAKE 30 MINUTES PRIOR TO MEALS TWICE DAILY. FOLLOW A LOW FAT DIET.  STOP SMOKING. FOLLOW UP IN OCT 2014 E30 SLF. IF YOU STILL HAVE PROBLEMS SWALLOWING, YOU WILL NEED A BARIUM SWALLOW.

## 2012-07-24 NOTE — Brief Op Note (Signed)
07/16/2012  6:19 PM  PATIENT:  Norman Zamora  42 y.o. male  PRE-OPERATIVE DIAGNOSIS:  Dyspepsia and Dysphagia  POST-OPERATIVE DIAGNOSIS:  NON-ULCER DYSPEPSIA  PROCEDURE:  Procedure(s): BRAVO PH STUDY; Catheter (lot [id number]/[id number] expires 12/16/2012) placed 39cm from teeth; study began 0933 (N/A)    POST-OPERATIVE DIAGNOSIS:  NON-ULCER DYSPEPSIA   PROCEDURE:  Procedure(s): BRAVO PH STUDY ON NEXIUM BID  SURGEON:  Surgeon(s): Arlyce Harman, MD  FINDINGS:  PT HAD RECORDER FOR 1 DAY AND 17:02   FRACTION Ph TOTAL:  DAY 1 0.5  DAY 2 0   # OF REFLUXES:    DAY 1 9 DAY 2 0   LONG REFLUX > 5:   DAY 1 0 DAY 2 0    LONGEST REFLUX:   DAY 1 1 DAY 2 0   REFLUX TABLE: UPRIGHT0 EPISODES, SUPINE 0  DEMEESTER SCORE DAY 1:  2.1 (NL < 14.72)  DEMEESTER SCORE DAY 2:  0.3 SAP TABLE: ACID REFLUX ANALYSIS: 98 % SUPINE  DIAGNOSIS: SX CONSISTENT WITH NON-ULCER DYSPEPSIA  PLAN: 1. PROTONIX BID.  2. CONTINUE EFFEXOR. 3. OPV IN OCT 2014-E:30 VISIT. CONSIDER BPE IF DYSPHAGIA CONTINUES. 4. STOP SMOKING.

## 2012-07-25 NOTE — Telephone Encounter (Signed)
Called and informed pt.  

## 2012-07-25 NOTE — Telephone Encounter (Signed)
Cc PCP 

## 2012-07-29 ENCOUNTER — Ambulatory Visit: Payer: Self-pay | Admitting: Pain Medicine

## 2012-07-30 NOTE — Telephone Encounter (Signed)
Reminder in epic °

## 2012-08-07 ENCOUNTER — Ambulatory Visit (INDEPENDENT_AMBULATORY_CARE_PROVIDER_SITE_OTHER): Payer: Medicare HMO | Admitting: Gastroenterology

## 2012-08-07 ENCOUNTER — Encounter: Payer: Self-pay | Admitting: Gastroenterology

## 2012-08-07 VITALS — BP 139/82 | HR 89 | Temp 98.4°F | Ht 75.0 in | Wt 207.0 lb

## 2012-08-07 DIAGNOSIS — R1310 Dysphagia, unspecified: Secondary | ICD-10-CM

## 2012-08-07 DIAGNOSIS — K648 Other hemorrhoids: Secondary | ICD-10-CM | POA: Insufficient documentation

## 2012-08-07 DIAGNOSIS — K603 Anal fistula, unspecified: Secondary | ICD-10-CM | POA: Insufficient documentation

## 2012-08-07 NOTE — Assessment & Plan Note (Signed)
SX IMPROVED BUT NOT RESOLVED.  MONITOR FOR RECURRENT SYMPTOMS. OPV IN OCT 2014. IF SX PERSIST, PT NEEDS BPE

## 2012-08-07 NOTE — Patient Instructions (Signed)
USE NITROGLYCERIN OINTMENT UNITL SEP 1   DRINK WATER TO KEEP YOUR URINE LIGHT YELLOW.  FOLLOW A HIGH FIBER DIET.   AVOID STRAINING AND CONSTIPATION.  FOLLOW UP IN OCT 2014

## 2012-08-07 NOTE — Assessment & Plan Note (Signed)
CONTINUE NTG OINTMENT UNTIL SEP 1. CONTINUE FIBER AND WATER. OPV IN OCT 2014

## 2012-08-07 NOTE — Assessment & Plan Note (Signed)
S/P R ANT BAND #3/3  NTG OINTMENT UNITL SEP 1  DRINK WATER EAT FIBER OPV OCT 2014

## 2012-08-07 NOTE — Progress Notes (Signed)
Subjective:    Patient ID: Norman Zamora, male    DOB: [birth year], 42 y.o.   MRN: [id number] Ernestine Conrad, MD  HPI Swallowing improved.  Past Medical History  Diagnosis Date  . IBS (irritable bowel syndrome) 2009 diarrhea predominant  . Hemorrhoids, internal, with bleeding 2011  . Fatty liver 2011 184 lbs  . HA (headache)   . Chronic pain NECK  . Genital herpes   . GERD (gastroesophageal reflux disease)     BRAVO 2013 with uncontrolled GERD, referred for Nissen, completed by Dr. Lovell Sheehan  . Anxiety   . Depression   . PTSD (post-traumatic stress disorder) FROM CHILDHOOD  . N&V (nausea and vomiting)   . Chronic diarrhea   . Muscle tremor   . Pneumothorax on left   . Sleep apnea     CPAP    Past Surgical History  Procedure Laterality Date  . Implantation / placement epidural neurostimulator electrodes  1995  . Upper gastrointestinal endoscopy  AUG 2011-diarrhea, dysphagia    NSAID GASTRITIS, NL DUODENUM, ESO DIL  16 MM  . Esophagogastroduodenoscopy  06/2010    distal esophageal web and ring dilated, patchy antral erythema bx showed mild chronic gastritis and no H.Pylori, distal esophageal bx was negative  . Colonoscopy  06/2010    frequent diverticula transverse colon to sigmoid colon, small internal hemorrhoid, slightly tortuous, random bx were negative. Next TCS 06/2020  . Neck surgery    . Tonsillectomy    . 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 Left lung.  . Wisdom tooth extraction    . Back surgery  2004, 2005    cervical disc fusion c3-4 and c6-7  . Bravo ph study  01/30/2011    Procedure: BRAVO PH STUDY;  Surgeon: Arlyce Harman, MD;  Location: [facility];  Service: Endoscopy;  Laterality: N/A;  Lot# [id number] Exp: 07/17/2011  . Esophageal biopsy  01/30/2011    Moderate gastritis MOST LIKELY 2o to MOBIC/ DYSPHAGIA MOST LIKeLY 2o TO UNCONTROLLED GERD, less likely eso motility disorder  . Laparoscopic nissen fundoplication  03/08/2011   Procedure: LAPAROSCOPIC NISSEN FUNDOPLICATION;  Surgeon: Dalia Heading, MD;  Location: [facility];  Service: General;  Laterality: N/A;  . Esophagogastroduodenoscopy (egd) with propofol N/A 07/16/2012    Procedure: ESOPHAGOGASTRODUODENOSCOPY (EGD) WITH PROPOFOL (Gastroesophageal junction 45cm from teeth);  Surgeon: West Bali, MD;  Location: [facility];  Service: Endoscopy;  Laterality: N/A;  . Savory dilation N/A 07/16/2012    Procedure: SAVORY DILATION ;  Surgeon: West Bali, MD;  Location: [facility];  Service: Endoscopy;  Laterality: N/A;  . Bravo ph study N/A 07/16/2012    Procedure: BRAVO PH STUDY; Catheter (lot 2013-12/[id number] expires 12/16/2012) placed 39cm from teeth; study began 0933;  Surgeon: West Bali, MD;  Location: [facility];  Service: Endoscopy;  Laterality: N/A;  . Esophageal biopsy N/A 07/16/2012    Procedure: GASTRIC BIOPSIES;  Surgeon: West Bali, MD;  Location: [facility];  Service: Endoscopy;  Laterality: N/A;    No Known Allergies  Current Outpatient Prescriptions  Medication Sig Dispense Refill  . bifidobacterium infantis (ALIGN) capsule Take 1 capsule by mouth daily.    . divalproex (DEPAKOTE) 500 MG EC tablet Take 500 mg by mouth 2 (two) times daily.     Marland Kitchen gabapentin (NEURONTIN) 400 MG capsule Take 400 mg by mouth 3 (three) times daily.    Marland Kitchen loperamide (LOPERAMIDE A-D) 2 MG tablet 1-2 PO QAC AND HS. NO  MORE THAN 8 PILLS A DAY    . mirtazapine (REMERON) 30 MG tablet Take 30 mg by mouth at bedtime.    . nitroGLYCERIN (NITROGLYN) 2 % ointment Place 0.5 inches onto the skin 3 (three) times daily. In anal canal    . pantoprazole (PROTONIX) 40 MG tablet 1 PO 30 MINUTES PRIOR TO MEALS BID    . venlafaxine (EFFEXOR) 75 MG tablet Take 75 mg by mouth 2 (two) times daily.         Review of Systems     Objective:   Physical Exam  Vitals reviewed. Constitutional: He is oriented to person, place, and time. He appears well-nourished. No distress.  HENT:  Head: Normocephalic and  atraumatic.  Mouth/Throat: Oropharynx is clear and moist. No oropharyngeal exudate.  Eyes: Pupils are equal, round, and reactive to light. No scleral icterus.  Neck: Normal range of motion. Neck supple.  Cardiovascular: Normal rate, regular rhythm and normal heart sounds.   Pulmonary/Chest: Effort normal and breath sounds normal. No respiratory distress.  Abdominal: Soft. Bowel sounds are normal. He exhibits no distension. There is tenderness. There is no rebound and no guarding.  MILD TTP x4  Neurological: He is alert and oriented to person, place, and time.  NO  NEW FOCAL DEFICITS   Psychiatric: He has a normal mood and affect.          Assessment & Plan:

## 2012-08-07 NOTE — Progress Notes (Signed)
SYMPTOMS: MILD RECTAL ITCHING, BMs: 2-3X/DAY-DIARRHEA. MILD PAIN WITH PASSING STOOL. FEELS PAIN AFTER BMs COMPLETE. SWALLOWING BETTER. STILL HAVING SOME TIGHTNESS WITH DRINKING WATER. RECTAL BLEEDING RESOLVED.  CONSTIPATION: NO DIARRHEA: YES  STRAINS WITH BMs: YES  TIME SPENT ON TOILET: 2 MINS TISSUE POKES OUT OF RECTUM: NO FIBER SUPPLEMENTS: YES GLASSES OF WATER/DAY: 6-8-YES  ADDITIONAL QUESTIONS:  LATEX ALLERGY: NO PREGNANT: NO ERECTILE DYSFUNCTION MEDS OR NITRATES: NO ANTICOAGULATION/ANTIPLATELET MEDS: NO DIAGNOSED WITH CROHN'S DISEASE, PROCTITIS, PORTAL HTN, OR ANAL/RECTAL CA: NO TAKING IMMUNOSUPPRESSANTS/XRT: NO  PLAN: 1. [facility] BANDING TODAY  PROCEDURE TECHNIQUE: BENEFITS RISK EXPLAINED TO PT. ONE [facility] BAND PLACED IN RIGHT ANTERIOR POSITION. MILD BLEEDING AFTER PLACEMENT. POST-BANDING RECTAL EXAM REVEALED GOOD PLACEMENT. EXAM NON-TENDER.

## 2012-08-09 ENCOUNTER — Ambulatory Visit: Payer: Self-pay | Admitting: Pain Medicine

## 2012-08-12 ENCOUNTER — Telehealth: Payer: Self-pay

## 2012-08-12 NOTE — Telephone Encounter (Signed)
PT had left VM on Sat that he had a BM with nothing but blood. When I called to check on him he said it happened again on Sun and he called and talked to Dr.Fields. He said to let her know that he had normal BM this AM and no blood. He will call if further problems.

## 2012-08-12 NOTE — Telephone Encounter (Signed)
REVIEWED. AGREE. 

## 2012-08-23 ENCOUNTER — Ambulatory Visit: Payer: Self-pay | Admitting: Pain Medicine

## 2012-08-23 ENCOUNTER — Other Ambulatory Visit: Payer: Self-pay | Admitting: Pain Medicine

## 2012-08-23 LAB — MAGNESIUM: Magnesium: 1.7 mg/dL — ABNORMAL LOW

## 2012-09-09 ENCOUNTER — Encounter (HOSPITAL_COMMUNITY): Payer: Self-pay | Admitting: *Deleted

## 2012-09-09 ENCOUNTER — Emergency Department (HOSPITAL_COMMUNITY)
Admission: EM | Admit: 2012-09-09 | Discharge: 2012-09-09 | Disposition: A | Discharge status: Home / Self Care | Payer: Medicare Other | Attending: Emergency Medicine | Admitting: Emergency Medicine

## 2012-09-09 ENCOUNTER — Emergency Department (HOSPITAL_COMMUNITY): Payer: Medicare Other

## 2012-09-09 DIAGNOSIS — Z79899 Other long term (current) drug therapy: Secondary | ICD-10-CM | POA: Insufficient documentation

## 2012-09-09 DIAGNOSIS — M5416 Radiculopathy, lumbar region: Secondary | ICD-10-CM

## 2012-09-09 DIAGNOSIS — Z8709 Personal history of other diseases of the respiratory system: Secondary | ICD-10-CM | POA: Insufficient documentation

## 2012-09-09 DIAGNOSIS — F4310 Post-traumatic stress disorder, unspecified: Secondary | ICD-10-CM | POA: Insufficient documentation

## 2012-09-09 DIAGNOSIS — IMO0002 Reserved for concepts with insufficient information to code with codable children: Secondary | ICD-10-CM | POA: Insufficient documentation

## 2012-09-09 DIAGNOSIS — F17200 Nicotine dependence, unspecified, uncomplicated: Secondary | ICD-10-CM | POA: Insufficient documentation

## 2012-09-09 DIAGNOSIS — K219 Gastro-esophageal reflux disease without esophagitis: Secondary | ICD-10-CM | POA: Insufficient documentation

## 2012-09-09 DIAGNOSIS — Z8619 Personal history of other infectious and parasitic diseases: Secondary | ICD-10-CM | POA: Insufficient documentation

## 2012-09-09 DIAGNOSIS — R52 Pain, unspecified: Secondary | ICD-10-CM | POA: Insufficient documentation

## 2012-09-09 DIAGNOSIS — Z8679 Personal history of other diseases of the circulatory system: Secondary | ICD-10-CM | POA: Insufficient documentation

## 2012-09-09 DIAGNOSIS — Z8719 Personal history of other diseases of the digestive system: Secondary | ICD-10-CM | POA: Insufficient documentation

## 2012-09-09 DIAGNOSIS — G4730 Sleep apnea, unspecified: Secondary | ICD-10-CM | POA: Insufficient documentation

## 2012-09-09 DIAGNOSIS — G8929 Other chronic pain: Secondary | ICD-10-CM | POA: Insufficient documentation

## 2012-09-09 DIAGNOSIS — F341 Dysthymic disorder: Secondary | ICD-10-CM | POA: Insufficient documentation

## 2012-09-09 LAB — URINALYSIS, ROUTINE W REFLEX MICROSCOPIC
Bilirubin Urine: NEGATIVE
Ketones, ur: NEGATIVE mg/dL
Leukocytes, UA: NEGATIVE
Nitrite: NEGATIVE
Protein, ur: NEGATIVE mg/dL
Urobilinogen, UA: 0.2 mg/dL (ref 0.0–1.0)

## 2012-09-09 MED ORDER — METHOCARBAMOL 500 MG PO TABS: 1000.0000 mg | ORAL_TABLET | Freq: Four times a day (QID) | ORAL | Status: AC

## 2012-09-09 MED ORDER — HYDROMORPHONE HCL PF 1 MG/ML IJ SOLN
2.0000 mg | Freq: Once | INTRAMUSCULAR | Status: AC
  Administered 2012-09-09: 2 mg via INTRAMUSCULAR
  Filled 2012-09-09: qty 2

## 2012-09-09 MED ORDER — ONDANSETRON 8 MG PO TBDP
8.0000 mg | ORAL_TABLET | Freq: Once | ORAL | Status: AC
  Administered 2012-09-09: 8 mg via ORAL
  Filled 2012-09-09: qty 1

## 2012-09-09 NOTE — ED Notes (Signed)
Back and neck pain,for 4-5 hours onset when changing position in his chair.

## 2012-09-09 NOTE — ED Provider Notes (Signed)
CSN: [id number]     Arrival date & time 09/09/12  1257 History     First MD Initiated Contact with Patient 09/09/12 1326     Chief Complaint  Patient presents with  . Back Pain   (Consider location/radiation/quality/duration/timing/severity/associated sxs/prior Treatment) HPI Comments: Norman Zamora is a 42 y.o. Male presenting with acute on chronic low back pain which has which has been present since he simply changed positions while sitting in his chair.   Patient denies any new injury specifically.  He describes sudden lancing, radiating pain from his lower back with radiation up his spine, into his right arm and also into his left leg.  The radiating pain has resolved but he still has low back pain which is worsened with movement.  There has been no weakness or numbness in the lower extremities and no urinary or bowel retention or incontinence.  Patient does not have a history of cancer or IVDU.  He does have a history of chronic low back and neck pain and is treated by pain management in [place].  He has 2 implanted tens units for his chronic pain syndrome.  He last took his oxycodone 5 mg this am which he uses prn pain.      The history is provided by the patient.    Past Medical History  Diagnosis Date  . IBS (irritable bowel syndrome) 2009 diarrhea predominant  . Hemorrhoids, internal, with bleeding 2011  . Fatty liver 2011 184 lbs  . HA (headache)   . Chronic pain NECK  . Genital herpes   . GERD (gastroesophageal reflux disease)     BRAVO 2013 with uncontrolled GERD, referred for Nissen, completed by Dr. Lovell Sheehan  . Anxiety   . Depression   . PTSD (post-traumatic stress disorder) FROM CHILDHOOD  . N&V (nausea and vomiting)   . Chronic diarrhea   . Muscle tremor   . Pneumothorax on left   . Sleep apnea     CPAP   Past Surgical History  Procedure Laterality Date  . Implantation / placement epidural neurostimulator electrodes  1995  . Upper gastrointestinal  endoscopy  AUG 2011-diarrhea, dysphagia    NSAID GASTRITIS, NL DUODENUM, ESO DIL  16 MM  . Esophagogastroduodenoscopy  06/2010    distal esophageal web and ring dilated, patchy antral erythema bx showed mild chronic gastritis and no H.Pylori, distal esophageal bx was negative  . Colonoscopy  06/2010    frequent diverticula transverse colon to sigmoid colon, small internal hemorrhoid, slightly tortuous, random bx were negative. Next TCS 06/2020  . Neck surgery    . Tonsillectomy    . 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 Left lung.  . Wisdom tooth extraction    . Back surgery  2004, 2005    cervical disc fusion c3-4 and c6-7  . Bravo ph study  01/30/2011    Procedure: BRAVO PH STUDY;  Surgeon: Arlyce Harman, MD;  Location: [facility];  Service: Endoscopy;  Laterality: N/A;  Lot# [id number] Exp: 07/17/2011  . Esophageal biopsy  01/30/2011    Moderate gastritis MOST LIKELY 2o to MOBIC/ DYSPHAGIA MOST LIKeLY 2o TO UNCONTROLLED GERD, less likely eso motility disorder  . Laparoscopic nissen fundoplication  03/08/2011    Procedure: LAPAROSCOPIC NISSEN FUNDOPLICATION;  Surgeon: Dalia Heading, MD;  Location: [facility];  Service: General;  Laterality: N/A;  . Esophagogastroduodenoscopy (egd) with propofol N/A 07/16/2012    Procedure: ESOPHAGOGASTRODUODENOSCOPY (EGD) WITH PROPOFOL (Gastroesophageal junction 45cm  from teeth);  Surgeon: West Bali, MD;  Location: [facility];  Service: Endoscopy;  Laterality: N/A;  . Savory dilation N/A 07/16/2012    Procedure: SAVORY DILATION ;  Surgeon: West Bali, MD;  Location: [facility];  Service: Endoscopy;  Laterality: N/A;  . Bravo ph study N/A 07/16/2012    Procedure: BRAVO PH STUDY; Catheter (lot [id number]/[id number] expires 12/16/2012) placed 39cm from teeth; study began 0933;  Surgeon: West Bali, MD;  Location: [facility];  Service: Endoscopy;  Laterality: N/A;  . Esophageal biopsy N/A 07/16/2012    Procedure: GASTRIC BIOPSIES;  Surgeon: West Bali, MD;  Location: [facility];  Service: Endoscopy;  Laterality: N/A;   Family History  Problem Relation Age of Onset  . Colon cancer Cousin     MATERNAL, 30s  . Throat cancer Maternal Grandfather   . Anesthesia problems Neg Hx   . Hypotension Neg Hx   . Malignant hyperthermia Neg Hx   . Pseudochol deficiency Neg Hx    History  Substance Use Topics  . Smoking status: Current Every Day Smoker -- 1.00 packs/day for 30 years    Types: Cigarettes  . Smokeless tobacco: Not on file  . Alcohol Use: No    Review of Systems  Constitutional: Negative for fever.  Respiratory: Negative for shortness of breath.   Cardiovascular: Negative for chest pain and leg swelling.  Gastrointestinal: Negative for abdominal pain, constipation and abdominal distention.  Genitourinary: Negative for dysuria, urgency, frequency, flank pain and difficulty urinating.  Musculoskeletal: Positive for back pain. Negative for joint swelling and gait problem.  Skin: Negative for rash.  Neurological: Negative for weakness and numbness.    Allergies  Review of patient's allergies indicates no known allergies.  Home Medications   Current Outpatient Rx  Name  Route  Sig  Dispense  Refill  . bifidobacterium infantis (ALIGN) capsule   Oral   Take 1 capsule by mouth daily.   14 capsule   0   . Cholecalciferol (VITAMIN D3) 2000 UNITS capsule   Oral   Take 2,000 Units by mouth daily.         . divalproex (DEPAKOTE) 500 MG EC tablet   Oral   Take 500 mg by mouth 2 (two) times daily.          Marland Kitchen gabapentin (NEURONTIN) 400 MG capsule   Oral   Take 400 mg by mouth 3 (three) times daily.         Marland Kitchen loperamide (IMODIUM) 2 MG capsule   Oral   Take 4 mg by mouth 4 (four) times daily as needed for diarrhea or loose stools.         . magnesium oxide (MAG-OX) 400 MG tablet   Oral   Take 400 mg by mouth daily.         . mirtazapine (REMERON) 30 MG tablet   Oral   Take 30 mg by mouth at bedtime.          Marland Kitchen oxyCODONE (OXY IR/ROXICODONE) 5 MG immediate release tablet   Oral   Take 5 mg by mouth 3 (three) times daily as needed for pain.         . pantoprazole (PROTONIX) 40 MG tablet   Oral   Take 40 mg by mouth 2 (two) times daily before a meal.         . venlafaxine (EFFEXOR) 75 MG tablet   Oral   Take 75 mg by mouth 2 (two) times  daily.         . Vitamin D, Ergocalciferol, (DRISDOL) 50000 UNITS CAPS capsule   Oral   Take 1 capsule by mouth 2 (two) times a week. Takes on Sunday and wednesday         . methocarbamol (ROBAXIN) 500 MG tablet   Oral   Take 2 tablets (1,000 mg total) by mouth 4 (four) times daily.   40 tablet   0   . nitroGLYCERIN (NITROGLYN) 2 % ointment   Transdermal   Place 0.5 inches onto the skin 3 (three) times daily. In anal canal          BP 130/78  Pulse 78  Temp(Src) 98 F (36.7 C) (Oral)  Resp 18  Ht 6\' 3"  (1.905 m)  Wt 210 lb (95.255 kg)  BMI 26.25 kg/m2  SpO2 98% Physical Exam  Nursing note and vitals reviewed. Constitutional: He appears well-developed and well-nourished.  HENT:  Head: Normocephalic.  Eyes: Conjunctivae are normal.  Neck: Normal range of motion. Neck supple.  Cardiovascular: Normal rate and intact distal pulses.   Pedal pulses normal.  Pulmonary/Chest: Effort normal.  Abdominal: Soft. Bowel sounds are normal. He exhibits no distension and no mass.  Musculoskeletal: Normal range of motion. He exhibits no edema.       Lumbar back: He exhibits tenderness. He exhibits no swelling, no edema and no spasm.  ttp along lower lumbar and paralumbar musculature.  Neurological: He is alert. He has normal strength. He displays no atrophy and no tremor. No sensory deficit. Gait normal.  Reflex Scores:      Patellar reflexes are 2+ on the right side and 2+ on the left side.      Achilles reflexes are 2+ on the right side and 2+ on the left side. No strength deficit noted in hip and knee flexor and extensor muscle groups.   Ankle flexion and extension intact.  Skin: Skin is warm and dry.  Psychiatric: He has a normal mood and affect.    ED Course   Procedures (including critical care time)  Labs Reviewed  URINALYSIS, ROUTINE W REFLEX MICROSCOPIC - Abnormal; Notable for the following:    Specific Gravity, Urine <1.005 (*)    All other components within normal limits   Dg Cervical Spine Complete  09/09/2012   *RADIOLOGY REPORT*  Clinical Data: Back and neck pain.  CERVICAL SPINE - COMPLETE 4+ VIEW  Comparison: Cervical spine radiograph 04/18/2012.  Findings: Postoperative changes of ACDF are again noted at the C3- C4 and C6-C7.  There appears to be complete or near complete bony fusion of both of these levels.  Alignment is anatomic.  No acute displaced fractures of the cervical spine.  Prevertebral soft tissues are normal.  Spinal cord stimulator in position along the posterior aspect of the spinal canal.  IMPRESSION: 1.  No acute radiographic abnormality of the cervical spine. 2.  Postoperative changes and support apparatus, as above.   Original Report Authenticated By: Trudie Reed, M.D.   Dg Lumbar Spine Complete  09/09/2012   *RADIOLOGY REPORT*  Clinical Data: Low back pain.  LUMBAR SPINE - COMPLETE 4+ VIEW  Comparison: 04/18/2012.  Findings: No acute displaced fractures or compression fractures are identified.  Mild anterior wedging of L1 appears to be physiologic and is completely unchanged compared to the prior examination from 04/18/2012.  Alignment is anatomic.  No defects of the pars interarticularis are noted.  Electronic devices project over the buttocks bilaterally with leads extending toward the  thoracic region (lead tips are incompletely visualized).  IMPRESSION: 1.  No acute radiographic abnormality of the lumbar spine.   Original Report Authenticated By: Trudie Reed, M.D.   1. Lumbar radiculopathy, acute     MDM  Pt given dilaudid 2 mg IM injection with improved pain.    Patients labs  and/or radiological studies were viewed and considered during the medical decision making and disposition process. No stress fractures noted on plain film, which was a concern given stated history of osteopenia.  No neuro deficit on exam or by history to suggest emergent or surgical presentation.  Also discussed worsened sx that should prompt immediate re-evaluation including distal weakness, bowel/bladder retention/incontinence.  Pt encouraged to f/u with pain management prn.  He was prescribed robaxin for muscle spasm.      Burgess Amor, PA-C 09/10/12 1309

## 2012-09-10 NOTE — ED Provider Notes (Signed)
Medical screening examination/treatment/procedure(s) were performed by non-physician practitioner and as supervising physician I was immediately available for consultation/collaboration.  Donnetta Hutching, MD 09/10/12 1531

## 2012-09-13 ENCOUNTER — Ambulatory Visit: Payer: Self-pay | Admitting: Pain Medicine

## 2012-09-19 ENCOUNTER — Ambulatory Visit: Payer: Self-pay | Admitting: Pain Medicine

## 2012-10-14 ENCOUNTER — Ambulatory Visit: Payer: Self-pay | Admitting: Pain Medicine

## 2012-10-15 ENCOUNTER — Encounter: Payer: Self-pay | Admitting: Gastroenterology

## 2012-10-22 ENCOUNTER — Ambulatory Visit: Payer: Self-pay | Admitting: Pain Medicine

## 2012-10-24 ENCOUNTER — Encounter (INDEPENDENT_AMBULATORY_CARE_PROVIDER_SITE_OTHER): Payer: Self-pay

## 2012-10-24 ENCOUNTER — Ambulatory Visit (INDEPENDENT_AMBULATORY_CARE_PROVIDER_SITE_OTHER): Payer: Medicare HMO | Admitting: Gastroenterology

## 2012-10-24 ENCOUNTER — Other Ambulatory Visit: Payer: Self-pay | Admitting: Gastroenterology

## 2012-10-24 ENCOUNTER — Encounter: Payer: Self-pay | Admitting: Gastroenterology

## 2012-10-24 VITALS — BP 138/84 | HR 96 | Temp 98.4°F | Ht 75.0 in | Wt 222.2 lb

## 2012-10-24 DIAGNOSIS — R1310 Dysphagia, unspecified: Secondary | ICD-10-CM

## 2012-10-24 DIAGNOSIS — K648 Other hemorrhoids: Secondary | ICD-10-CM

## 2012-10-24 DIAGNOSIS — K219 Gastro-esophageal reflux disease without esophagitis: Secondary | ICD-10-CM

## 2012-10-24 IMAGING — NM NM GASTRIC EMPTYING
1 series · 1 of 1 positions shown · non-contrast
Comparison: none

CLINICAL DATA: Nausea, weight loss

NUCLEAR MEDICINE GASTRIC EMPTYING EXAM:
Radiopharmaceutical:  2 mCi 4c-33m sulfur colloid labeled egg
whites
TECHNIQUE: The patient ingested a standardized meal containing radiolabeled
egg whites.
Imaging was performed in the anterior projection for 120 minutes.
Gastric emptying is calculated from the obtained images.

[ge gastric emptying · 1 of 1 slices shown]
[im 1/1]
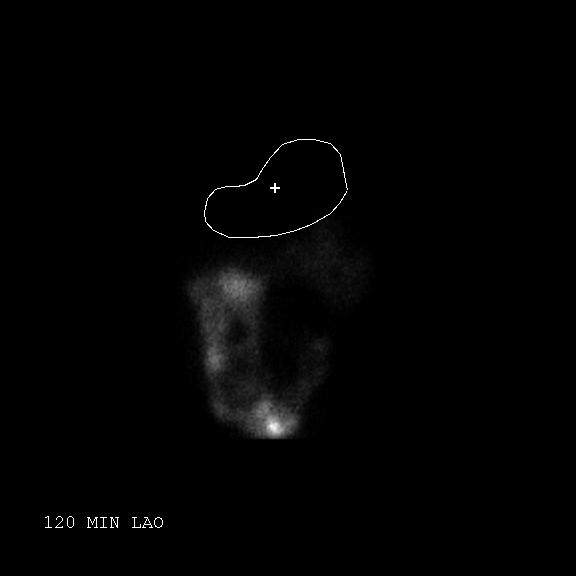

[1 of 1 positions shown; findings below may reference images not displayed]

FINDINGS: Subjectively normal emptying of tracer from the stomach during the
exam.
Quantitative analysis reveals 63% emptying at 1 hour and 98% at 2
hours.
Findings represent normal gastric emptying.
IMPRESSION: Normal gastric emptying exam.

## 2012-10-24 NOTE — Assessment & Plan Note (Signed)
NOT IDEALLY CONTROLLED 7 ASSOCIATED WITH 36 LB WEIGHT GAIN  STOP SMOKING CONTINUE YOUR WEIGHT LOSS EFFORTS. SOFT MECH/LOW FTA DIET BID PPI OPV IN 4 MOS

## 2012-10-24 NOTE — Patient Instructions (Signed)
CONTINUE YOUR WEIGHT LOSS EFFORTS.  COMPLETE BARIUM SWALLOWING TEST  FOLLOW A LOW FAT/SOFT MECHANICAL DIET. SEE INFO BELOW.  CONTINUE PROTONIX. TAKE 30 MINUTES PRIOR TO MEALS TWICE DAILY.  FOLLOW UP IN 4 MOS.    SOFT MECHANICAL DIET This SOFT MECHANICAL DIET is restricted to:  Foods that are moist, soft-textured, and easy to chew and swallow. MEATS SHOULD BE CHOPPED OR GROUND.  Meats that are ground or are minced no larger than one-quarter inch pieces. Meats are moist with gravy or sauce added.   Foods that do not include bread or bread-like textures except soft pancakes, well-moistened with syrup or sauce.   Textures with some chewing ability required.   Casseroles without rice.   Cooked vegetables that are less than half an inch in size and easily mashed with a fork. No cooked corn, peas, broccoli, cauliflower, cabbage, Brussels sprouts, asparagus, or other fibrous, non-tender or rubbery cooked vegetables.   Canned fruit except for pineapple. Fruit must be cut into pieces no larger than half an inch in size.   Foods that do not include nuts, seeds, coconut, or sticky textures.    FOOD TEXTURES FOR DYSPHAGIA DIET LEVEL 2 -SOFT MECHANICAL DIET (includes all foods on Dysphagia Diet Level 1 - Pureed, in addition to the foods listed below)  FOOD GROUP: Breads. RECOMMENDED: Soft pancakes, well-moistened with syrup or sauce. AVOID: All others.  FOOD GROUP: Cereals. RECOMMENDED: Cooked cereals with little texture, including oatmeal. Unprocessed wheat bran stirred into cereals for bulk. Note: If thin liquids are restricted, it is important that all of the liquid is absorbed into the cereal. AVOID: All dry cereals and any cooked cereals that may contain flax seeds or other seeds or nuts. Whole-grain, dry, or coarse cereals. Cereals with nuts, seeds, dried fruit, and/or coconut.  FOOD GROUP: Desserts. RECOMMENDED: Pudding, custard. Soft fruit pies with bottom crust only. Canned  fruit (excluding pineapple). Soft, moist cakes with icing.Frozen malts, milk shakes, frozen yogurt, eggnog, nutritional supplements, ice cream, sherbet, regular or sugar-free gelatin, or any foods that become thin liquid at either room (70 F) or body temperature (98 F). AVOID: Dry, coarse cakes and cookies. Anything with nuts, seeds, coconut, pineapple, or dried fruit. Breakfast yogurt with nuts. Rice or bread pudding.  FOOD GROUP: Fats. RECOMMENDED: Butter, margarine, cream for cereal (depending on liquid consistency recommendations), gravy, cream sauces, sour cream, sour cream dips with soft additives, mayonnaise, salad dressings, cream cheese, cream cheese spreads with soft additives, whipped toppings. AVOID: All fats with coarse or chunky additives.  FOOD GROUP: Fruits. RECOMMENDED: Soft drained, canned, or cooked fruits without seeds or skin. Fresh soft and ripe banana. Fruit juices with a small amount of pulp. If thin liquids are restricted, fruit juices should be thickened to appropriate consistency. AVOID: Fresh or frozen fruits. Cooked fruit with skin or seeds. Dried fruits. Fresh, canned, or cooked pineapple.  FOOD GROUP: Meats and Meat Substitutes. (Meat pieces should not exceed 1/4 of an inch cube and should be tender.) RECOMMENDED: Moistened ground or cooked meat, poultry, or fish. Moist ground or tender meat may be served with gravy or sauce. Casseroles without rice. Moist macaroni and cheese, well-cooked pasta with meat sauce, tuna noodle casserole, soft, moist lasagna. Moist meatballs, meatloaf, or fish loaf. Protein salads, such as tuna or egg without large chunks, celery, or onion. Cottage cheese, smooth quiche without large chunks. Poached, scrambled, or soft-cooked eggs (egg yolks should not be "runny" but should be moist and able to be mashed with  butter, margarine, or other moisture added to them). (Cook eggs to 160 F or use pasteurized eggs for safety.) Souffls may have  small, soft chunks. Tofu. Well-cooked, slightly mashed, moist legumes, such as baked beans. All meats or protein substitutes should be served with sauces or moistened to help maintain cohesiveness in the oral cavity. AVOID: Dry meats, tough meats (such as bacon, sausage, hot dogs, bratwurst). Dry casseroles or casseroles with rice or large chunks. Peanut butter. Cheese slices and cubes. Hard-cooked or crisp fried eggs. Sandwiches.Pizza.  FOOD GROUP: Potatoes and Starches. RECOMMENDED: Well-cooked, moistened, boiled, baked, or mashed potatoes. Well-cooked shredded hash brown potatoes that are not crisp. (All potatoes need to be moist and in sauces.)Well-cooked noodles in sauce. Spaetzel or soft dumplings that have been moistened with butter or gravy. AVOID: Potato skins and chips. Fried or French-fried potatoes. Rice.  FOOD GROUP: Soups. RECOMMENDED: Soups with easy-to-chew or easy-to-swallow meats or vegetables: Particle sizes in soups should be less than 1/2 inch. Soups will need to be thickened to appropriate consistency if soup is thinner than prescribed liquid consistency. AVOID: Soups with large chunks of meat and vegetables. Soups with rice, corn, peas.  FOOD GROUP: Vegetables. RECOMMENDED: All soft, well-cooked vegetables. Vegetables should be less than a half inch. Should be easily mashed with a fork. AVOID: Cooked corn and peas. Broccoli, cabbage, Brussels sprouts, asparagus, or other fibrous, non-tender or rubbery cooked vegetables.  FOOD GROUP: Miscellaneous. RECOMMENDED: Jams and preserves without seeds, jelly. Sauces, salsas, etc., that may have small tender chunks less than 1/2 inch. Soft, smooth chocolate bars that are easily chewed. AVOID: Seeds, nuts, coconut, or sticky foods. Chewy candies such as caramels or licorice.      Low-Fat Diet BREADS, CEREALS, PASTA, RICE, DRIED PEAS, AND BEANS These products are high in carbohydrates and most are low in fat. Therefore, they can  be increased in the diet as substitutes for fatty foods. They too, however, contain calories and should not be eaten in excess. Cereals can be eaten for snacks as well as for breakfast.   FRUITS AND VEGETABLES It is good to eat fruits and vegetables. Besides being sources of fiber, both are rich in vitamins and some minerals. They help you get the daily allowances of these nutrients. Fruits and vegetables can be used for snacks and desserts.  MEATS Limit lean meat, chicken, [place], and fish to no more than 6 ounces per day. Beef, Pork, and Lamb Use lean cuts of beef, pork, and lamb. Lean cuts include:  Extra-lean ground beef.  Arm roast.  Sirloin tip.  Center-cut ham.  Round steak.  Loin chops.  Rump roast.  Tenderloin.  Trim all fat off the outside of meats before cooking. It is not necessary to severely decrease the intake of red meat, but lean choices should be made. Lean meat is rich in protein and contains a highly absorbable form of iron. Premenopausal women, in particular, should avoid reducing lean red meat because this could increase the risk for low red blood cells (iron-deficiency anemia).  Chicken and [place] These are good sources of protein. The fat of poultry can be reduced by removing the skin and underlying fat layers before cooking. Chicken and [place] can be substituted for lean red meat in the diet. Poultry should not be fried or covered with high-fat sauces. Fish and Shellfish Fish is a good source of protein. Shellfish contain cholesterol, but they usually are low in saturated fatty acids. The preparation of fish is important. Like chicken  and [place], they should not be fried or covered with high-fat sauces. EGGS Egg whites contain no fat or cholesterol. They can be eaten often. Try 1 to 2 egg whites instead of whole eggs in recipes or use egg substitutes that do not contain yolk. MILK AND DAIRY PRODUCTS Use skim or 1% milk instead of 2% or whole milk. Decrease whole  milk, natural, and processed cheeses. Use nonfat or low-fat (2%) cottage cheese or low-fat cheeses made from vegetable oils. Choose nonfat or low-fat (1 to 2%) yogurt. Experiment with evaporated skim milk in recipes that call for heavy cream. Substitute low-fat yogurt or low-fat cottage cheese for sour cream in dips and salad dressings. Have at least 2 servings of low-fat dairy products, such as 2 glasses of skim (or 1%) milk each day to help get your daily calcium intake. FATS AND OILS Reduce the total intake of fats, especially saturated fat. Butterfat, lard, and beef fats are high in saturated fat and cholesterol. These should be avoided as much as possible. Vegetable fats do not contain cholesterol, but certain vegetable fats, such as coconut oil, palm oil, and palm kernel oil are very high in saturated fats. These should be limited. These fats are often used in bakery goods, processed foods, popcorn, oils, and nondairy creamers. Vegetable shortenings and some peanut butters contain hydrogenated oils, which are also saturated fats. Read the labels on these foods and check for saturated vegetable oils. Unsaturated vegetable oils and fats do not raise blood cholesterol. However, they should be limited because they are fats and are high in calories. Total fat should still be limited to 30% of your daily caloric intake. Desirable liquid vegetable oils are corn oil, cottonseed oil, olive oil, canola oil, safflower oil, soybean oil, and sunflower oil. Peanut oil is not as good, but small amounts are acceptable. Buy a heart-healthy tub margarine that has no partially hydrogenated oils in the ingredients. Mayonnaise and salad dressings often are made from unsaturated fats, but they should also be limited because of their high calorie and fat content. Seeds, nuts, peanut butter, olives, and avocados are high in fat, but the fat is mainly the unsaturated type. These foods should be limited mainly to avoid excess  calories and fat. OTHER EATING TIPS Snacks  Most sweets should be limited as snacks. They tend to be rich in calories and fats, and their caloric content outweighs their nutritional value. Some good choices in snacks are graham crackers, melba toast, soda crackers, bagels (no egg), English muffins, fruits, and vegetables. These snacks are preferable to snack crackers, [place] fries, TORTILLA CHIPS, and POTATO chips. Popcorn should be air-popped or cooked in small amounts of liquid vegetable oil. Desserts Eat fruit, low-fat yogurt, and fruit ices instead of pastries, cake, and cookies. Sherbet, angel food cake, gelatin dessert, frozen low-fat yogurt, or other frozen products that do not contain saturated fat (pure fruit juice bars, frozen ice pops) are also acceptable.  COOKING METHODS Choose those methods that use little or no fat. They include: Poaching.  Braising.  Steaming.  Grilling.  Baking.  Stir-frying.  Broiling.  Microwaving.  Foods can be cooked in a nonstick pan without added fat, or use a nonfat cooking spray in regular cookware. Limit fried foods and avoid frying in saturated fat. Add moisture to lean meats by using water, broth, cooking wines, and other nonfat or low-fat sauces along with the cooking methods mentioned above. Soups and stews should be chilled after cooking. The fat that forms  on top after a few hours in the refrigerator should be skimmed off. When preparing meals, avoid using excess salt. Salt can contribute to raising blood pressure in some people.  EATING AWAY FROM HOME Order entres, potatoes, and vegetables without sauces or butter. When meat exceeds the size of a deck of cards (3 to 4 ounces), the rest can be taken home for another meal. Choose vegetable or fruit salads and ask for low-calorie salad dressings to be served on the side. Use dressings sparingly. Limit high-fat toppings, such as bacon, crumbled eggs, cheese, sunflower seeds, and olives. Ask for  heart-healthy tub margarine instead of butter.

## 2012-10-24 NOTE — Progress Notes (Signed)
Subjective:    Patient ID: Norman Zamora, male    DOB: [birth year], 42 y.o.   MRN: [id number]  Ernestine Conrad, MD  HPI Weight gain: APR 2013 189 lbs and now 222 lbs. Hemorrhoids are better. The IBS has been under good control. Still has pain in belly before he goes and a little bit after. Pain gest better after a BM. BMs: 2x/day-#5. NO RECTAL BLEEDING, PRESSURE, PAIN, ITCHING, occasional rectal BURNING with bmS. NO SOILING. DONE USING OINTMENT. HEARTBURN CONTROL: SO/SO-1-2X/DAY.  SWALLOWING: EVERY DAY. FELLS LIKE PILLS ARE STUCK. DRINKS GETS STUCK.  Past Medical History  Diagnosis Date  . IBS (irritable bowel syndrome) 2009 diarrhea predominant  . Hemorrhoids, internal, with bleeding 2011  . Fatty liver 2011 184 lbs  . HA (headache)   . Chronic pain NECK  . Genital herpes   . GERD (gastroesophageal reflux disease)     BRAVO 2013 with uncontrolled GERD, referred for Nissen, completed by Dr. Lovell Sheehan  . Anxiety   . Depression   . PTSD (post-traumatic stress disorder) FROM CHILDHOOD  . N&V (nausea and vomiting)   . Chronic diarrhea   . Muscle tremor   . Pneumothorax on left   . Sleep apnea     CPAP    Past Surgical History  Procedure Laterality Date  . Implantation / placement epidural neurostimulator electrodes  1995  . Upper gastrointestinal endoscopy  AUG 2011-diarrhea, dysphagia    NSAID GASTRITIS, NL DUODENUM, ESO DIL  16 MM  . Esophagogastroduodenoscopy  06/2010    distal esophageal web and ring dilated, patchy antral erythema bx showed mild chronic gastritis and no H.Pylori, distal esophageal bx was negative  . Colonoscopy  06/2010    frequent diverticula transverse colon to sigmoid colon, small internal hemorrhoid, slightly tortuous, random bx were negative. Next TCS 06/2020  . Neck surgery    . Tonsillectomy    . 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 Left lung.  . Wisdom tooth extraction    . Back surgery  2004, 2005    cervical disc fusion c3-4  and c6-7  . Bravo ph study  01/30/2011    Procedure: BRAVO PH STUDY;  Surgeon: Arlyce Harman, MD;  Location: [facility];  Service: Endoscopy;  Laterality: N/A;  Lot# [id number] Exp: 07/17/2011  . Esophageal biopsy  01/30/2011    Moderate gastritis MOST LIKELY 2o to MOBIC/ DYSPHAGIA MOST LIKeLY 2o TO UNCONTROLLED GERD, less likely eso motility disorder  . Laparoscopic nissen fundoplication  03/08/2011    Procedure: LAPAROSCOPIC NISSEN FUNDOPLICATION;  Surgeon: Dalia Heading, MD;  Location: [facility];  Service: General;  Laterality: N/A;  . Esophagogastroduodenoscopy (egd) with propofol N/A 07/16/2012    Procedure: ESOPHAGOGASTRODUODENOSCOPY (EGD) WITH PROPOFOL (Gastroesophageal junction 45cm from teeth);  Surgeon: West Bali, MD;  Location: [facility];  Service: Endoscopy;  Laterality: N/A;  . Savory dilation N/A 07/16/2012    Procedure: SAVORY DILATION ;  Surgeon: West Bali, MD;  Location: [facility];  Service: Endoscopy;  Laterality: N/A;  . Bravo ph study N/A 07/16/2012    Procedure: BRAVO PH STUDY; Catheter (lot [id number] expires 12/16/2012) placed 39cm from teeth; study began 0933;  Surgeon: West Bali, MD;  Location: [facility];  Service: Endoscopy;  Laterality: N/A;  . Esophageal biopsy N/A 07/16/2012    Procedure: GASTRIC BIOPSIES;  Surgeon: West Bali, MD;  Location: [facility];  Service: Endoscopy;  Laterality: N/A;      Review of Systems  Objective:   Physical Exam  Vitals reviewed. Constitutional: He is oriented to person, place, and time. He appears well-nourished. No distress.  HENT:  Head: Normocephalic and atraumatic.  Mouth/Throat: Oropharynx is clear and moist. No oropharyngeal exudate.  Eyes: Pupils are equal, round, and reactive to light. No scleral icterus.  Neck: Normal range of motion. Neck supple.  Cardiovascular: Normal rate, regular rhythm and normal heart sounds.   Pulmonary/Chest: Effort normal and breath sounds normal. No respiratory distress.  Abdominal:  Soft. Bowel sounds are normal. He exhibits no distension. There is tenderness. There is no rebound and no guarding.  MILD PERI-UMBILICAL & LLQ TTP   Musculoskeletal: He exhibits no edema.  Lymphadenopathy:    He has no cervical adenopathy.  Neurological: He is alert and oriented to person, place, and time.  NO FOCAL DEFICITS   Psychiatric: He has a normal mood and affect.          Assessment & Plan:

## 2012-10-24 NOTE — Assessment & Plan Note (Signed)
SX RESOLVED.  CONTINUE TO MONITOR FOR RECURRENT SYMPTOMS.

## 2012-10-24 NOTE — Progress Notes (Signed)
CC'd to PCP 

## 2012-10-24 NOTE — Assessment & Plan Note (Signed)
SX RESOLVED.  CONTINUE TO MONITOR SYMPTOMS. AVOID CONSTIPATION DRINK WATER EAT FIBER OPV IN 4 MOS

## 2012-10-24 NOTE — Assessment & Plan Note (Addendum)
DIFFICULTY SWALLOWING PILLS, AND LIQUIDS AS WELL AS SOLID FOOD. DIFFERENTIAL DIAGNOSIS INCLUDES 1o ESO MOTILITY D/O, 2o ESO MOTILITY DISORDER DUE TO UNCONTROLLED GERD,A ND LESS LIKELY ESO STRICTURE/WEB.  BPE WITHIN THE NEXT 7 DAYS SOFT MECH/LOWFAT DIET BID PROTONIX OPV IN 4 MOS

## 2012-10-29 NOTE — Progress Notes (Signed)
Reminder in epic °

## 2012-10-30 ENCOUNTER — Ambulatory Visit (HOSPITAL_COMMUNITY)
Admission: RE | Admit: 2012-10-30 | Discharge: 2012-10-30 | Disposition: A | Discharge status: Home / Self Care | Payer: Medicare HMO | Source: Ambulatory Visit | Attending: Gastroenterology | Admitting: Gastroenterology

## 2012-10-30 DIAGNOSIS — R1310 Dysphagia, unspecified: Secondary | ICD-10-CM | POA: Insufficient documentation

## 2012-10-30 DIAGNOSIS — K224 Dyskinesia of esophagus: Secondary | ICD-10-CM | POA: Insufficient documentation

## 2012-10-31 NOTE — Progress Notes (Signed)
LMOM to call.

## 2012-10-31 NOTE — Progress Notes (Signed)
PLEASE CALL PT. HIS SWALLOWING TEST SHOWS A WEAK ESOPHAGUS. HIS ESOPHAGUS ESSENTIALLY OPENS BY GRAVITY. HE DOES NOT HAVE A STRICTURE AND DOES NOT NEED ENDOSCOPY.   HE SHOULD: 1. STOP SMOKING 2. CONTINUE HIS WEIGHT LOSS EFFORTS.  3. FOLLOW A LOW FAT/SOFT MECHANICAL DIET FOREVER.   4. CONTINUE PROTONIX. TAKE 30 MINUTES PRIOR TO MEALS TWICE DAILY.  5. FOLLOW UP IN 4 MOS.

## 2012-11-01 NOTE — Progress Notes (Signed)
Pt aware of results 

## 2012-11-15 ENCOUNTER — Ambulatory Visit: Payer: Self-pay | Admitting: Pain Medicine

## 2013-02-14 ENCOUNTER — Ambulatory Visit: Payer: Self-pay | Admitting: Pain Medicine

## 2013-02-14 ENCOUNTER — Other Ambulatory Visit: Payer: Self-pay | Admitting: Pain Medicine

## 2013-02-14 LAB — MAGNESIUM: Magnesium: 1.8 mg/dL

## 2013-05-01 ENCOUNTER — Other Ambulatory Visit: Payer: Self-pay

## 2013-05-01 MED ORDER — PANTOPRAZOLE SODIUM 40 MG PO TBEC
40.0000 mg | DELAYED_RELEASE_TABLET | Freq: Two times a day (BID) | ORAL | Status: DC
Start: 2013-05-01 — End: 2013-11-27

## 2013-06-23 ENCOUNTER — Telehealth: Payer: Self-pay | Admitting: General Practice

## 2013-06-23 NOTE — Telephone Encounter (Signed)
LMOM for pt to call with a little more info.

## 2013-06-23 NOTE — Telephone Encounter (Signed)
Patient called in to schedule a follow-up visit with Dr. Darrick Penna.  He states that he is having loose stools and can't make it to the bathroom, when he sneezes or coughs.  He has also been having a lot of gas with some abdominal bloating.  He is scheduled to see Dr. Darrick Penna on 7/16 and was wondering if he should wait that long or does she have any recommendations now, while he is waiting to be seen in July.

## 2013-06-23 NOTE — Telephone Encounter (Signed)
Pt returned call and said he is having about 5 loose/watery stools daily, sometimes just 3-4.  He has some abdominal pain and it gets very bad at times. A lot of bloating and gas. Please advise!

## 2013-06-25 NOTE — Telephone Encounter (Addendum)
PLEASE CALL PT. HE HAS IBS AND MAY HAVE ABDOMINAL PAIN/DIARRHEA. USE IMODIUM BID TO DECREASE LOOSE STOOLS. FOLLOW A DAIRY FREE DIET. FOR ONE MONTH. CONTINUE PROBIOTIC DAILY. WE WILL PUT HIM ON A LIST TO CALL IF A PT CANCELS.

## 2013-06-26 NOTE — Telephone Encounter (Signed)
REVIEWED.  

## 2013-06-26 NOTE — Telephone Encounter (Signed)
Called and informed pt he will be on cancellation list. He is aware of Dr. Evelina Dun recommendations.  He wants Dr. Darrick Penna to know that he was diagnosed with high blood pressure last week at his PCP.  They started him on Amlodipine 5 mg qd and said that it might also help with his IBS but he will discuss at his office visit.

## 2013-07-31 ENCOUNTER — Encounter: Payer: Self-pay | Admitting: Gastroenterology

## 2013-07-31 ENCOUNTER — Encounter (INDEPENDENT_AMBULATORY_CARE_PROVIDER_SITE_OTHER): Payer: Self-pay

## 2013-07-31 ENCOUNTER — Ambulatory Visit (INDEPENDENT_AMBULATORY_CARE_PROVIDER_SITE_OTHER): Payer: Medicare HMO | Admitting: Gastroenterology

## 2013-07-31 VITALS — BP 128/78 | HR 68 | Temp 97.5°F | Ht 75.0 in | Wt 228.2 lb

## 2013-07-31 DIAGNOSIS — R1310 Dysphagia, unspecified: Secondary | ICD-10-CM

## 2013-07-31 DIAGNOSIS — K589 Irritable bowel syndrome without diarrhea: Secondary | ICD-10-CM

## 2013-07-31 MED ORDER — RIFAXIMIN 550 MG PO TABS: 550.0000 mg | ORAL_TABLET | Freq: Three times a day (TID) | ORAL | Status: AC

## 2013-07-31 MED ORDER — DICYCLOMINE HCL 10 MG PO CAPS: ORAL_CAPSULE | ORAL | Status: AC

## 2013-07-31 NOTE — Patient Instructions (Signed)
ADD RIFAXIMIN THREE TIMES DAILY  FOR 2 WEEKS.  AVOID DAIRY FOR 2 WEEKS. SEE INFO BELOW.  HOLD FIBER.  BENTYL 10 MG TWICE DAILY BEFORE LUNCH AND AT BEDTIME.  CALL WITH QUESTIONS OR CONCERNS IF SX NOT IMPROVED IN 2 WEEKS  FOLLOW UP IN 2 MOS.    Lactose Free Diet Lactose is a carbohydrate that is found mainly in milk and milk products, as well as in foods with added milk or whey. Lactose must be digested by the enzyme in order to be used by the body. Lactose intolerance occurs when there is a shortage of lactase. When your body is not able to digest lactose, you may feel sick to your stomach (nausea), bloating, cramping, gas and diarrhea.  There are many dairy products that may be tolerated better than milk by some people:  The use of cultured dairy products such as yogurt, buttermilk, cottage cheese, and sweet acidophilus milk (Kefir) for lactase-deficient individuals is usually well tolerated. This is because the healthy bacteria help digest lactose.   Lactose-hydrolyzed milk (Lactaid) contains 40-90% less lactose than milk and may also be well tolerated.    SPECIAL NOTES  Lactose is a carbohydrates. The major food source is dairy products. Reading food labels is important. Many products contain lactose even when they are not made from milk. Look for the following words: whey, milk solids, dry milk solids, nonfat dry milk powder. Typical sources of lactose other than dairy products include breads, candies, cold cuts, prepared and processed foods, and commercial sauces and gravies.   All foods must be prepared without milk, cream, or other dairy foods.   Soy milk and lactose-free supplements (LACTASE) may be used as an alternative to milk.   FOOD GROUP ALLOWED/RECOMMENDED AVOID/USE SPARINGLY  BREADS / STARCHES 4 servings or more* Breads and rolls made without milk. [place], [place], or [place] bread. Breads and rolls that contain milk. Prepared mixes such as muffins, biscuits,  waffles, pancakes. Sweet rolls, donuts, [place] toast (if made with milk or lactose).  Crackers: Soda crackers, graham crackers. Any crackers prepared without lactose. Zwieback crackers, corn curls, or any that contain lactose.  Cereals: Cooked or dry cereals prepared without lactose (read labels). Cooked or dry cereals prepared with lactose (read labels). Total, Cocoa Krispies. Special K.  Potatoes / Pasta / Rice: Any prepared without milk or lactose. Popcorn. Instant potatoes, frozen [place] fries, scalloped or au gratin potatoes.  VEGETABLES 2 servings or more Fresh, frozen, and canned vegetables. Creamed or breaded vegetables. Vegetables in a cheese sauce or with lactose-containing margarines.  FRUIT 2 servings or more All fresh, canned, or frozen fruits that are not processed with lactose. Any canned or frozen fruits processed with lactose.  MEAT & SUBSTITUTES 2 servings or more (4 to 6 oz. total per day) Plain beef, chicken, fish, [place], lamb, veal, pork, or ham. Kosher prepared meat products. Strained or junior meats that do not contain milk. Eggs, soy meat substitutes, nuts. Scrambled eggs, omelets, and souffles that contain milk. Creamed or breaded meat, fish, or fowl. Sausage products such as wieners, liver sausage, or cold cuts that contain milk solids. Cheese, cottage cheese, or cheese spreads.  MILK None. (See "BEVERAGES" for milk substitutes. See "DESSERTS" for ice cream and frozen desserts.) Milk (whole, 2%, skim, or chocolate). Evaporated, powdered, or condensed milk; malted milk.  SOUPS & COMBINATION FOODS Bouillon, broth, vegetable soups, clear soups, consomms. Homemade soups made with allowed ingredients. Combination or prepared foods that do not contain milk or  milk products (read labels). Cream soups, chowders, commercially prepared soups containing lactose. Macaroni and cheese, pizza. Combination or prepared foods that contain milk or milk products.  DESSERTS & SWEETS In  moderation Water and fruit ices; gelatin; angel food cake. Homemade cookies, pies, or cakes made from allowed ingredients. Pudding (if made with water or a milk substitute). Lactose-free tofu desserts. Sugar, honey, corn syrup, jam, jelly; marmalade; molasses (beet sugar); Pure sugar candy; marshmallows. Ice cream, ice milk, sherbet, custard, pudding, frozen yogurt. Commercial cake and cookie mixes. Desserts that contain chocolate. Pie crust made with milk-containing margarine; reduced-calorie desserts made with a sugar substitute that contains lactose. Toffee, peppermint, butterscotch, chocolate, caramels.  FATS & OILS In moderation Butter (as tolerated; contains very small amounts of lactose). Margarines and dressings that do not contain milk, Vegetable oils, shortening, Miracle Whip, mayonnaise, nondairy cream & whipped toppings without lactose or milk solids added (examples: Coffee Rich, Carnation Coffeemate, Rich's Whipped Topping, PolyRich). Tomasa BlaseBacon. Margarines and salad dressings containing milk; cream, cream cheese; peanut butter with added milk solids, sour cream, chip dips, made with sour cream.  BEVERAGES Carbonated drinks; tea; coffee and freeze-dried coffee; some instant coffees (check labels). Fruit drinks; fruit and vegetable juice; Rice or Soy milk. Ovaltine, hot chocolate. Some cocoas; some instant coffees; instant iced teas; powdered fruit drinks (read labels).   CONDIMENTS / MISCELLANEOUS Soy sauce, carob powder, olives, gravy made with water, baker's cocoa, pickles, pure seasonings and spices, wine, pure monosodium glutamate, catsup, mustard. Some chewing gums, chocolate, some cocoas. Certain antibiotics and vitamin / mineral preparations. Spice blends if they contain milk products. MSG extender. Artificial sweeteners that contain lactose such as Equal (Nutra-Sweet) and Sweet 'n Low. Some nondairy creamers (read labels).   SAMPLE MENU*  Breakfast   Orange Juice.  Banana.    Bran flakes.   Nondairy Creamer.  Vienna Bread (toasted).   Butter or milk-free margarine.   Coffee or tea.    Noon Meal   Chicken Breast.  Rice.   Green beans.   Butter or milk-free margarine.  Fresh melon.   Coffee or tea.    Evening Meal   Roast Beef.  Baked potato.   Butter or milk-free margarine.   Broccoli.   Lettuce salad with vinegar and oil dressing.  [employer] food cake.   Coffee or tea.

## 2013-07-31 NOTE — Progress Notes (Signed)
[facility] Ernest[facility] ColumbiaCrots[facility] Dr. Carlynn HeraldPatrick Green on 08/28/13 at 1:00 and Mr. Ernestina ColumbiaCrotsley is aware

## 2013-07-31 NOTE — Progress Notes (Signed)
Subjective:    Patient ID: Norman Zamora, male    DOB: 06/24/1970, 43 y.o.   MRN: [id number]  Ernestine ConradBLUTH, KIRK, MD  HPI Sx worse since 2-2.5 mos been pretty bad. BmS: 3-5-LARGE, MOST OF THE TIME SML TO MEDIUM. WAKES UP A NIGHT TO GO TO BM: 2X/WK. NO NL FORMED STOOL-LAST TIME FOR A COUPLE OF YEARS. LOWER BIL ABD PAIN(SHARP/CRAMPY) GOES TO BM AND BETTER BUT MAY FEEL LIKE HE HAS TO GO AGAIN BUT NO MORE COMES OUT. NO TRAVEL, ABX, OR WELL WATER. RECTAL URGENCY: 2-4X/DAY. MAY LOSE CONTROL OF BOWELS: 1-2X/WEEK. STILL FEELS LIKE HE HAS PROBLEMS SWALLOWING: PILLS AND FLUID STICKS. GAINED 76 LBS SINCE OCT 2014. MILK: A GLASS A DAY OR LESS. ICE CREAM: RARE. CHEESE: RARE.NO SORES IN MOUTH, RASH ON LEGS, or JOINT PAIN. No change in BACK PAIN.   PT DENIES FEVER, CHILLS, HEMATOCHEZIA, nausea, vomiting, melena, CHEST PAIN, SHORTNESS OF BREATH, constipation, OR Heartburn or indigestion.  Past Medical History  Diagnosis Date  . IBS (irritable bowel syndrome) 2009 diarrhea predominant  . Hemorrhoids, internal, with bleeding 2011  . Fatty liver 2011 184 lbs  . HA (headache)   . Chronic pain NECK  . Genital herpes   . GERD (gastroesophageal reflux disease)     BRAVO 2013 with uncontrolled GERD, referred for Nissen, completed by Dr. Lovell SheehanJenkins  . Anxiety   . Depression   . PTSD (post-traumatic stress disorder) FROM CHILDHOOD  . N&V (nausea and vomiting)   . Chronic diarrhea   . Muscle tremor   . Pneumothorax on left   . Sleep apnea     CPAP   Past Surgical History  Procedure Laterality Date  . Implantation / placement epidural neurostimulator electrodes  1995  . Upper gastrointestinal endoscopy  AUG 2011-diarrhea, dysphagia    NSAID GASTRITIS, NL DUODENUM, ESO DIL  16 MM  . Esophagogastroduodenoscopy  06/2010    distal esophageal web and ring dilated, patchy antral erythema bx showed mild chronic gastritis and no H.Pylori, distal esophageal bx was negative  . Colonoscopy  06/2010    frequent  diverticula transverse colon to sigmoid colon, small internal hemorrhoid, slightly tortuous, random bx were negative. Next TCS 06/2020  . Neck surgery    . Tonsillectomy    . 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 Left lung.  . Wisdom tooth extraction    . Back surgery  2004, 2005    cervical disc fusion c3-4 and c6-7  . Bravo ph study  01/30/2011    Procedure: BRAVO PH STUDY;  Surgeon: Arlyce HarmanSandi M Fields, MD;  Location: [facility];  Service: Endoscopy;  Laterality: N/A;  Lot# [id number] Exp: 07/17/2011  . Esophageal biopsy  01/30/2011    Moderate gastritis MOST LIKELY 2o to MOBIC/ DYSPHAGIA MOST LIKeLY 2o TO UNCONTROLLED GERD, less likely eso motility disorder  . Laparoscopic nissen fundoplication  03/08/2011    Procedure: LAPAROSCOPIC NISSEN FUNDOPLICATION;  Surgeon: Dalia HeadingMark A Jenkins, MD;  Location: [facility];  Service: General;  Laterality: N/A;  . Esophagogastroduodenoscopy (egd) with propofol N/A 07/16/2012    Procedure: ESOPHAGOGASTRODUODENOSCOPY (EGD) WITH PROPOFOL (Gastroesophageal junction 45cm from teeth);  Surgeon: West BaliSandi L Fields, MD;  Location: [facility];  Service: Endoscopy;  Laterality: N/A;  . Savory dilation N/A 07/16/2012    Procedure: SAVORY DILATION 16MM;  Surgeon: West BaliSandi L Fields, MD;  Location: [facility];  Service: Endoscopy;  Laterality: N/A;  . Bravo ph study N/A 07/16/2012    Procedure: Ancil LinseyBRAVO  PH STUDY; Catheter (lot [id number] expires 12/16/2012) placed 39cm from teeth; study began 0933;  Surgeon: West Bali, MD;  Location: [facility];  Service: Endoscopy;  Laterality: N/A;  . Esophageal biopsy N/A 07/16/2012    Procedure: GASTRIC BIOPSIES;  Surgeon: West Bali, MD;  Location: [facility];  Service: Endoscopy;  Laterality: N/A;   No Known Allergies  Current Outpatient Prescriptions  Medication Sig Dispense Refill  . amLODipine (NORVASC) 5 MG tablet Take 5 mg by mouth daily.      . bifidobacterium infantis (ALIGN) capsule Take 1 capsule by mouth daily.    .  Cholecalciferol (VITAMIN D3) 2000 UNITS capsule Take 2,000 Units by mouth daily.    . divalproex (DEPAKOTE) 500 MG EC tablet Take 500 mg by mouth 2 (two) times daily.     Marland Kitchen gabapentin (NEURONTIN) 400 MG capsule Take 400 mg by mouth 3 (three) times daily.    Marland Kitchen loperamide (IMODIUM) 2 MG capsule Take 4 mg by mouth 4 (four) times daily as needed for diarrhea or loose stools. Not using   . magnesium oxide (MAG-OX) 400 MG tablet Take 400 mg by mouth daily.    . mirtazapine (REMERON) 30 MG tablet Take 30 mg by mouth at bedtime.    .      .      . pantoprazole (PROTONIX) 40 MG tablet Take 1 tablet (40 mg total) by mouth 2 (two) times daily before a meal.    . venlafaxine (EFFEXOR) 75 MG tablet Take 75 mg by mouth 2 (two) times daily.    . Vitamin D, Ergocalciferol, (DRISDOL) 50000 UNITS CAPS capsule Take 1 capsule by mouth 2 (two) times a week. Takes on Sunday and wednesday         Review of Systems     Objective:   Physical Exam  Vitals reviewed. Constitutional: He is oriented to person, place, and time. He appears well-developed and well-nourished. No distress.  HENT:  Head: Normocephalic and atraumatic.  Mouth/Throat: Oropharynx is clear and moist. No oropharyngeal exudate.  Eyes: Pupils are equal, round, and reactive to light. No scleral icterus.  Neck: Normal range of motion. Neck supple.  Cardiovascular: Normal rate, regular rhythm and normal heart sounds.   Pulmonary/Chest: Effort normal and breath sounds normal. No respiratory distress.  Abdominal: Soft. Bowel sounds are normal. He exhibits no distension. There is tenderness. There is no rebound and no guarding.  mild to moderate TTP IN BLQS AND PER-IUMBILICAL REGION.  Musculoskeletal: He exhibits no edema.  Lymphadenopathy:    He has no cervical adenopathy.  Neurological: He is alert and oriented to person, place, and time.  NO  NEW FOCAL DEFICITS   Psychiatric: He has a normal mood and affect.          Assessment & Plan:

## 2013-07-31 NOTE — Assessment & Plan Note (Signed)
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 SX NOT IDEALLY CONTROLLED.  [facility] FOR [facility]ET[facility]/IMPEDANCE. CONTINUE PPI. MODIFY DIET FOLLOW UP IN 2 MOS.

## 2013-07-31 NOTE — Progress Notes (Signed)
Reminder in epic °

## 2013-07-31 NOTE — Progress Notes (Signed)
cc'd to pcp 

## 2013-07-31 NOTE — Assessment & Plan Note (Addendum)
SX NOT IDEALLY CONTROLLED.   CHECK GIARDIA Ag AND C DIFF PCR. ADD RIFAXIMIN TID FOR 2 WEEKS AVOID DAIRY HOLD FIBER BENTYL 10 MG TWICE DAILY CALL WITH QUESTIONS OR CONCERNS IF SX NOT IMPROVED IN 2 WEEKS FOLLOW UP IN 2 MOS.

## 2013-08-02 LAB — CLOSTRIDIUM DIFFICILE BY PCR: CDIFFPCR: NOT DETECTED

## 2013-08-04 LAB — GIARDIA ANTIGEN: GIARDIA SCREEN (EIA): NEGATIVE

## 2013-08-06 ENCOUNTER — Telehealth: Payer: Self-pay

## 2013-08-06 NOTE — Telephone Encounter (Signed)
Tried to do a prior authorization for the xifaxin and it was denied. It can be appealed if you would like to do an appeal letter to send to [facility].

## 2013-08-25 NOTE — Progress Notes (Signed)
PLEASE CALL PT. HIS STOOL TEST FOR GIARDIA ND C DIFF ARE NEGATIVE. WE ARE WORKING ON GETTING XIFAXAN BECAUSE HIS INSURANCE DENIED COVERAGE.

## 2013-09-15 ENCOUNTER — Encounter: Payer: Self-pay | Admitting: Gastroenterology

## 2013-11-27 ENCOUNTER — Other Ambulatory Visit: Payer: Self-pay

## 2013-11-27 MED ORDER — PANTOPRAZOLE SODIUM 40 MG PO TBEC
40.0000 mg | DELAYED_RELEASE_TABLET | Freq: Two times a day (BID) | ORAL | Status: AC
Start: 2013-11-27 — End: ?

## 2013-12-18 NOTE — Telephone Encounter (Signed)
REVIEWED.  

## 2014-03-23 IMAGING — CR DG LUMBAR SPINE COMPLETE W/ BEND
1 series · 7 of 7 positions shown · non-contrast
Comparison: none

REASON FOR EXAM: myalgias  generalized pain neck pain radiculopathy and
how back pain
COMMENTS:

[Series 1: w lumbar spine ap · 0.14mm/px · 7 of 7 slices shown]
[im 1/7]
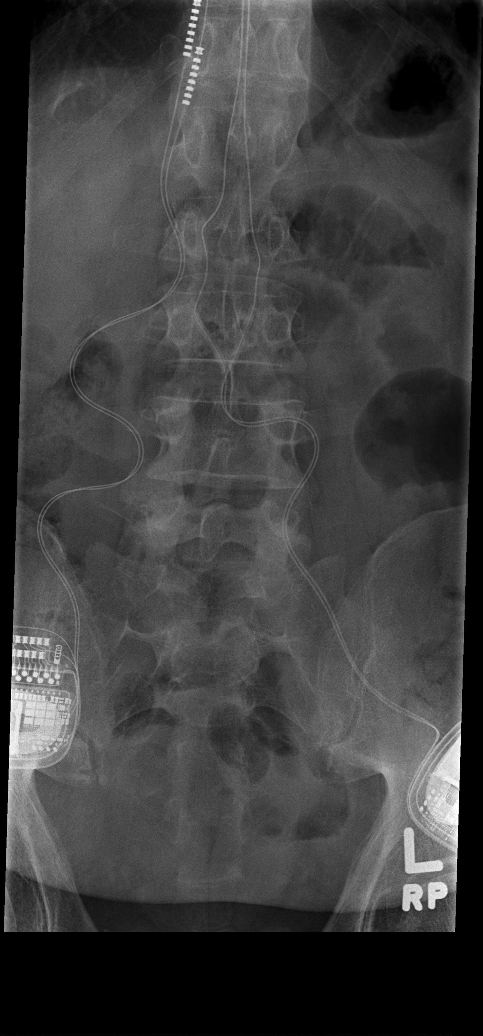
[im 2/7]
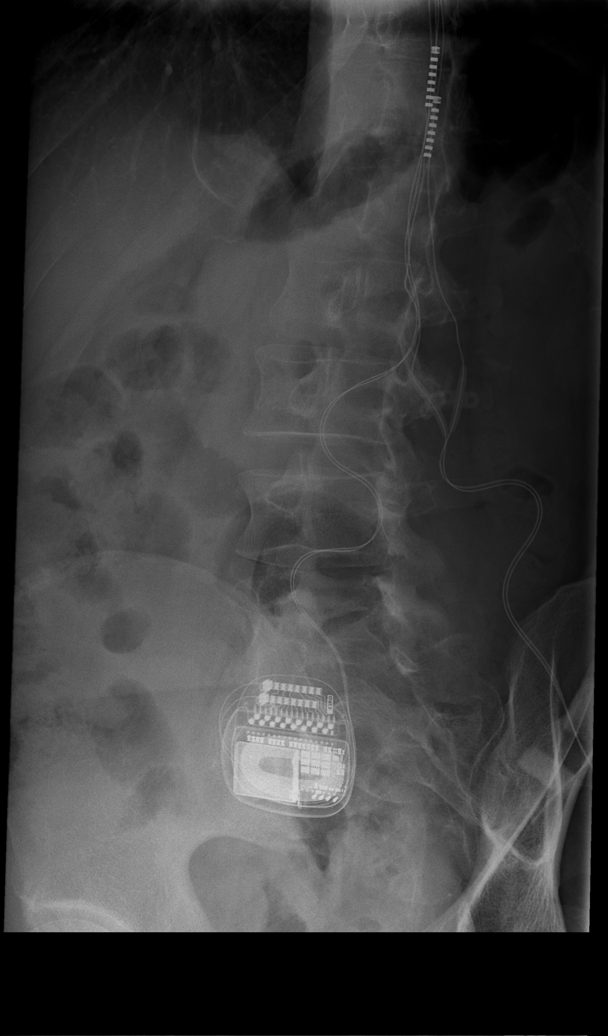
[im 3/7]
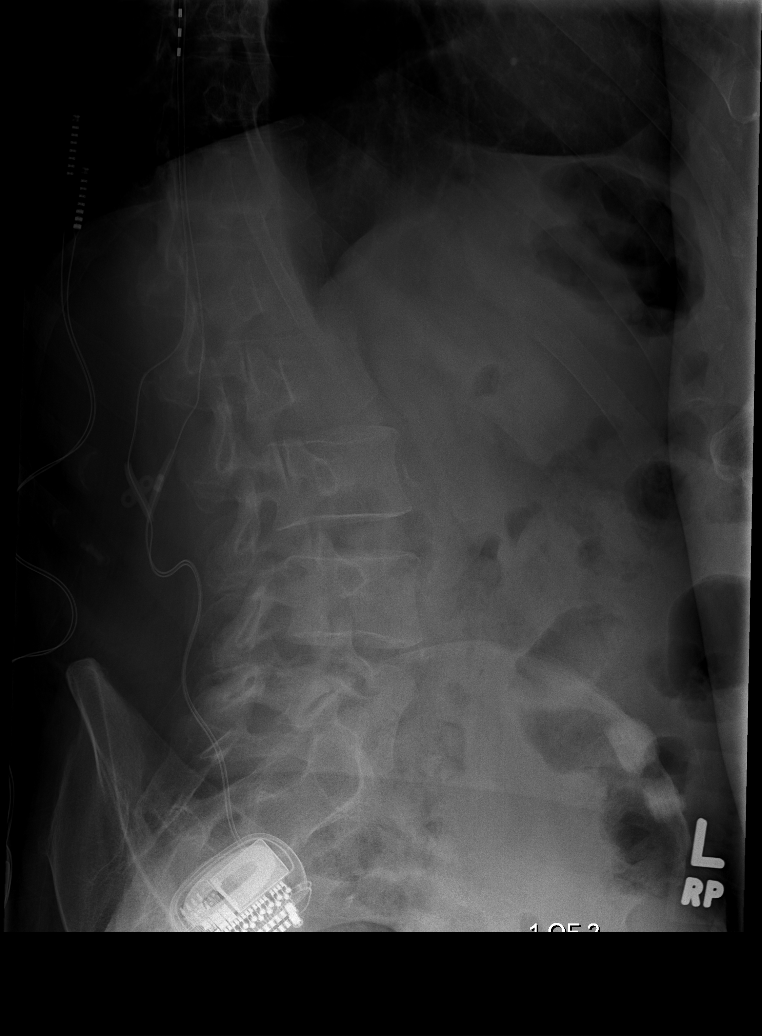
[im 4/7]
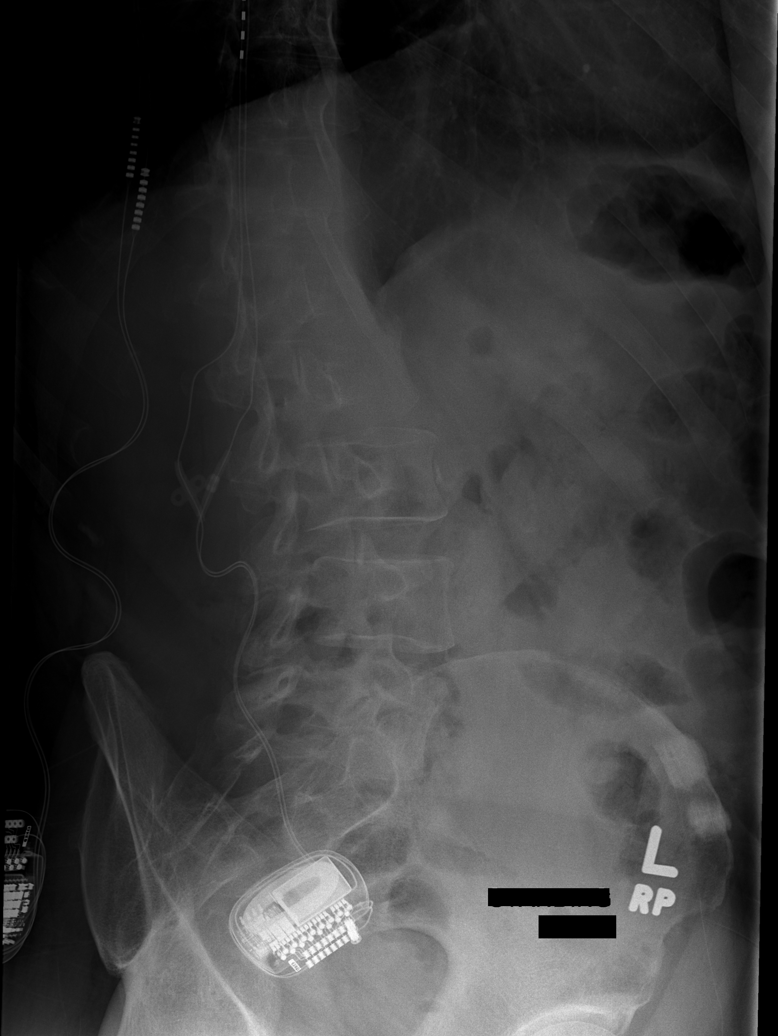
[im 5/7]
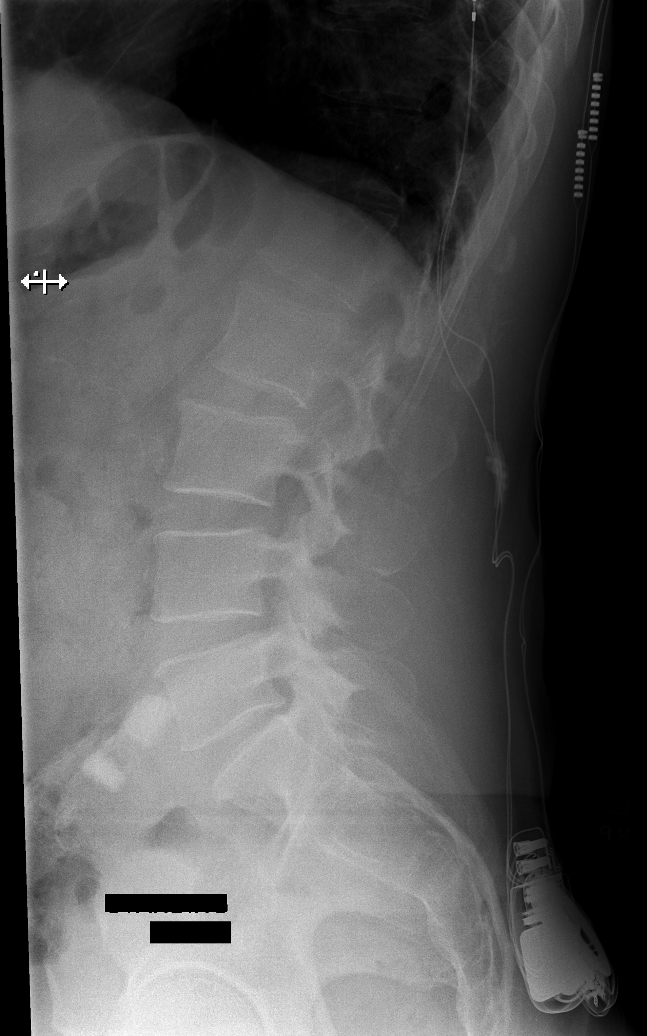
[im 6/7]
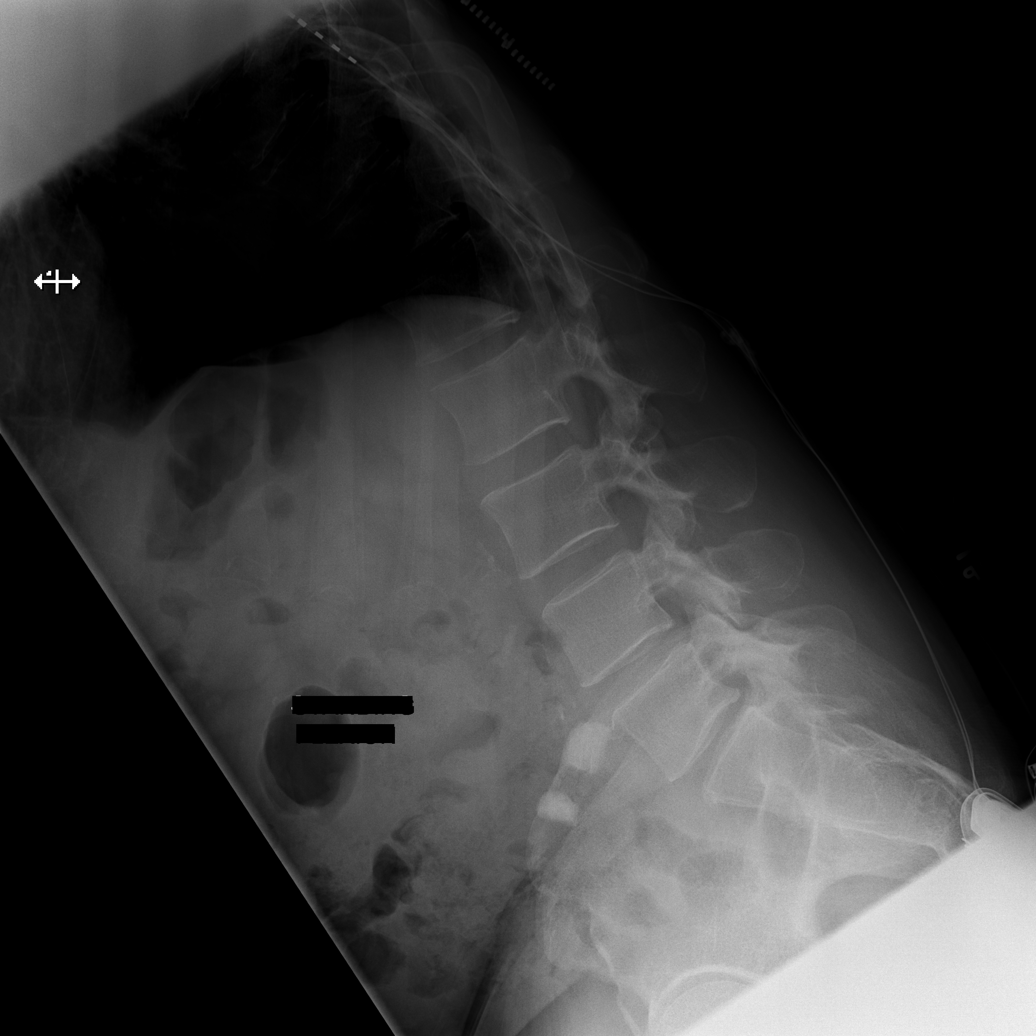
[im 7/7]
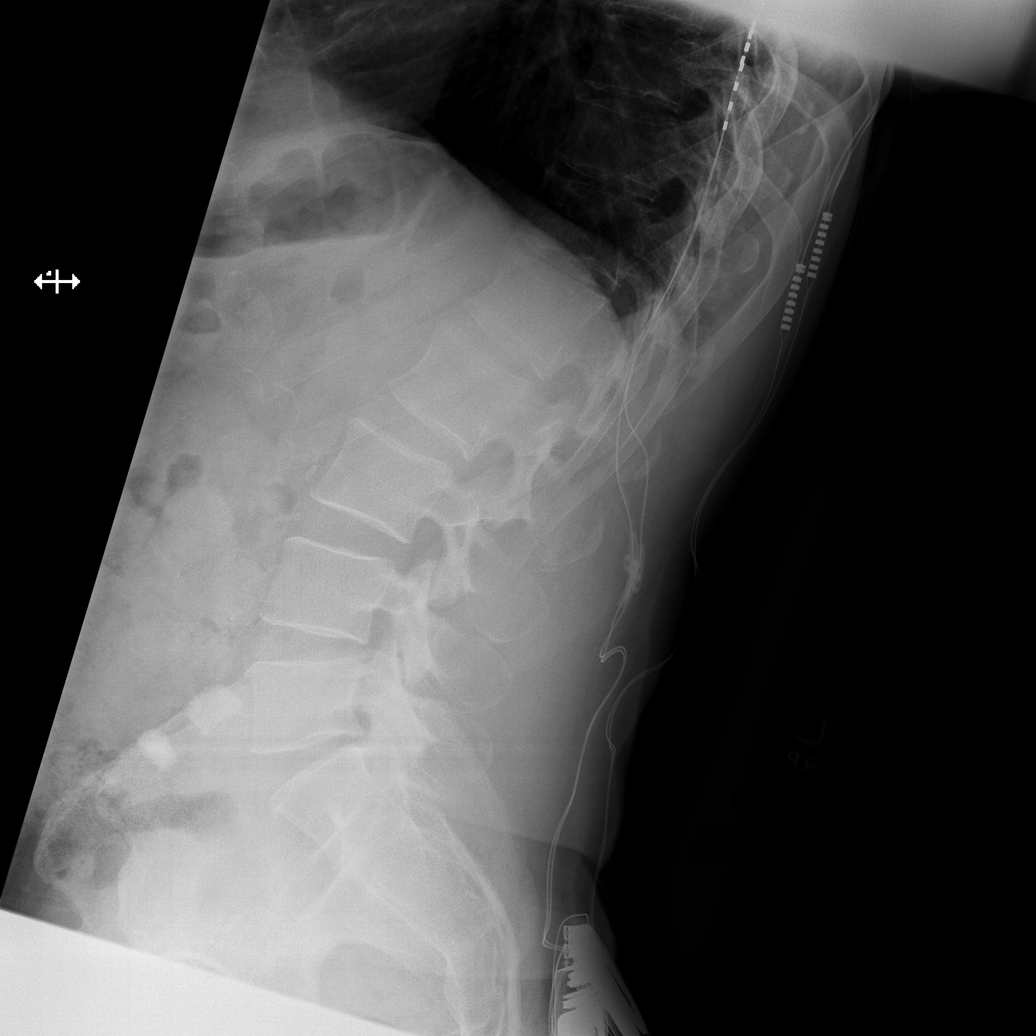

[7 of 7 positions shown; findings below may reference images not displayed]

PROCEDURE:     DXR - DXR LSP COMPLETE W/ FLEX/EXTEN  - July 05, 2012  [DATE]

RESULT:     Lumbar spine images show stimulator device over the lower pelvic
region bilaterally with leads projecting of the spine. There some air-fluid
levels in loops of small bowel. Correlate clinically for small bowel ileus
versus partial obstruction. The spinal alignment appears to be grossly
normal. The vertebral body heights and intervertebral disc spaces appear to
be maintained. There is no fracture or bony destruction. The flexion and
extension lateral views show no evidence of subluxation or instability.
IMPRESSION: No acute bony abnormality.

[REDACTED]

## 2014-03-23 IMAGING — CR CERVICAL SPINE - COMPLETE 4+ VIEW
1 series · 5 of 5 positions shown · non-contrast
Comparison: none

REASON FOR EXAM: myalgias  generalized pain neck pain radiculopathy and
how back pain
COMMENTS:

[Series 1: w cervical spine lat · 0.14mm/px · 5 of 5 slices shown]
[im 1/5]
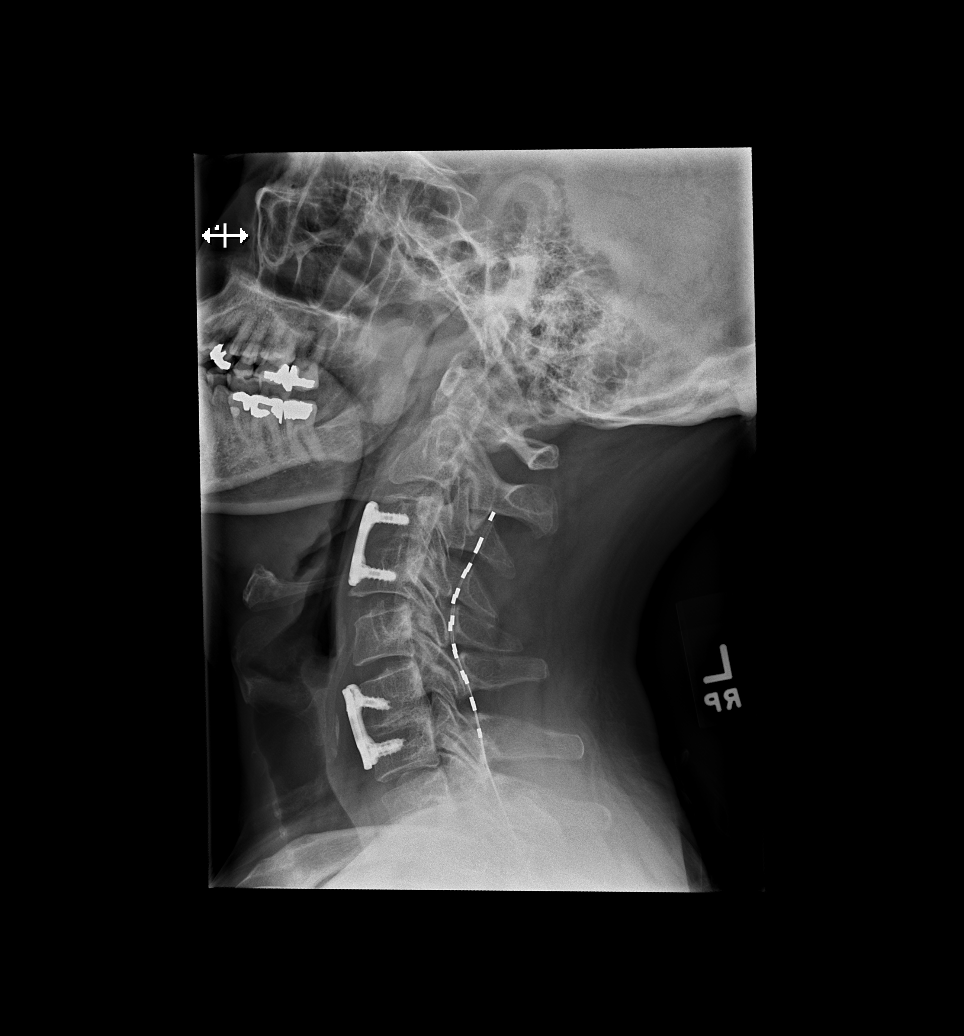
[im 2/5]
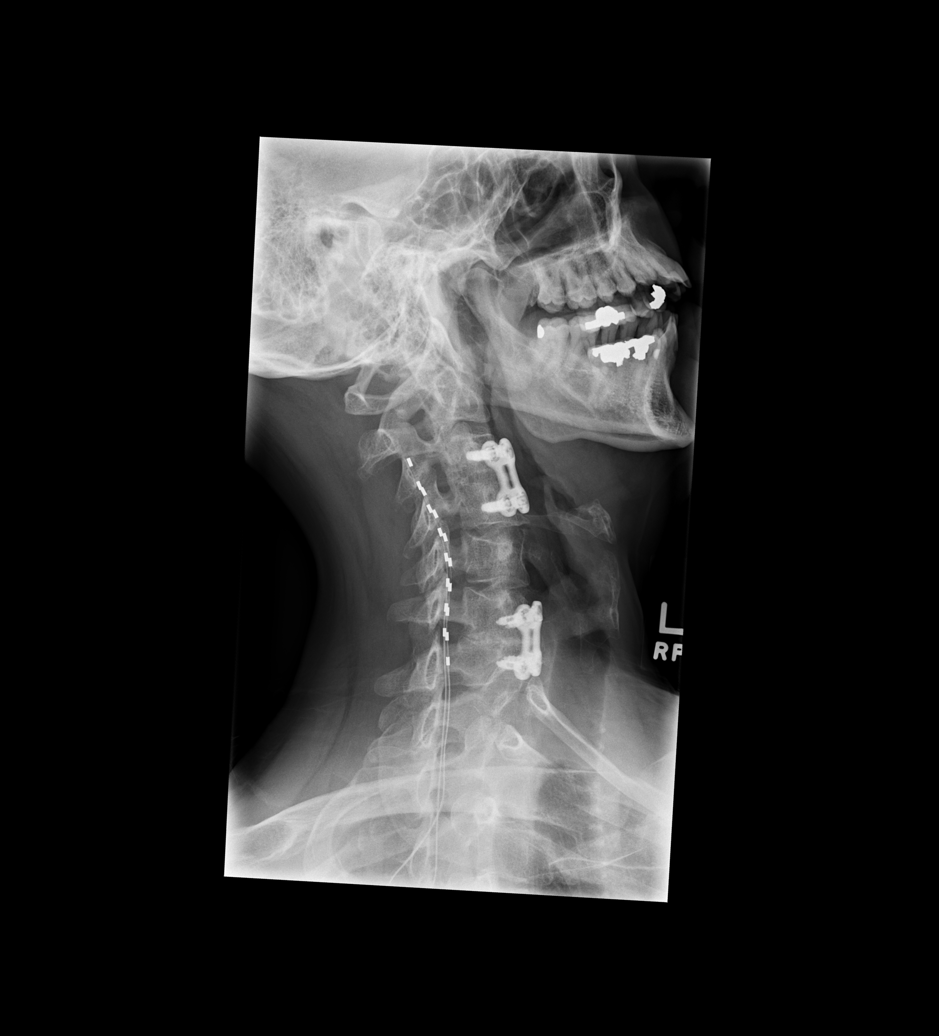
[im 3/5]
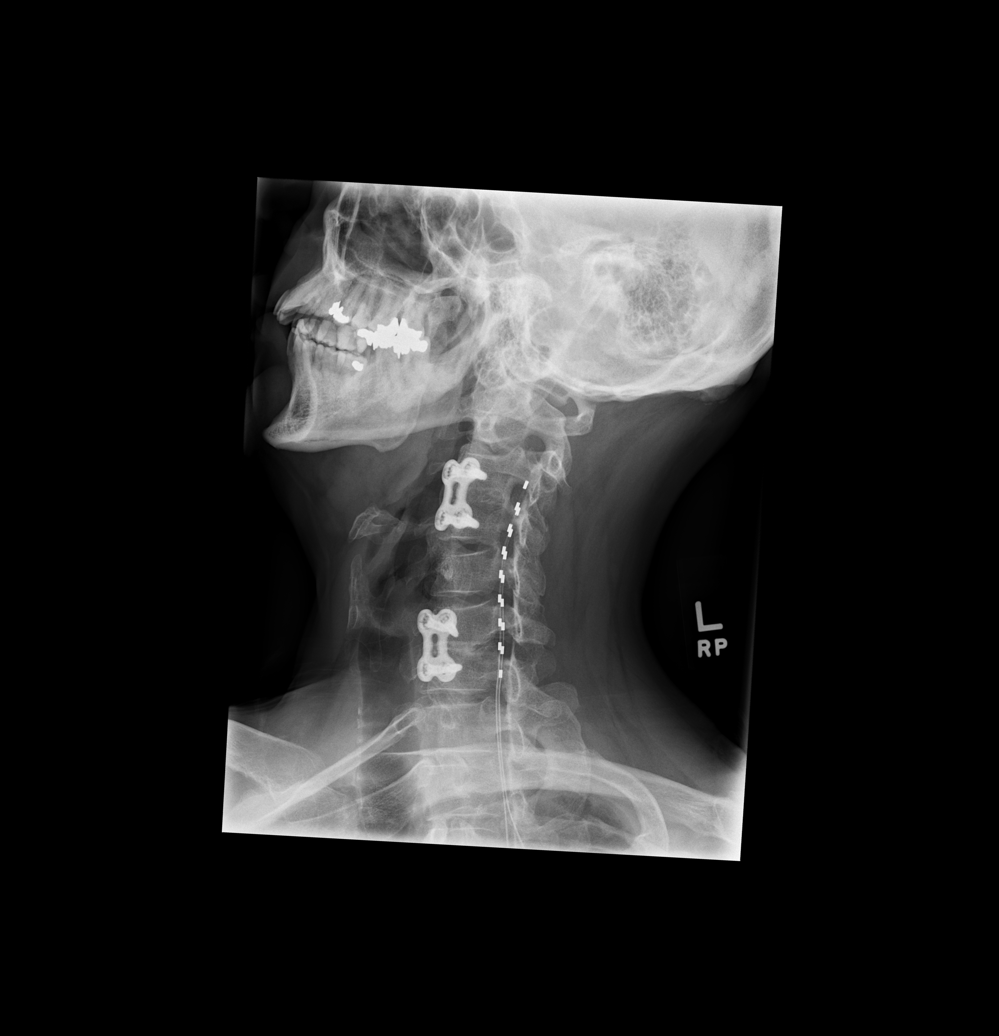
[im 4/5]
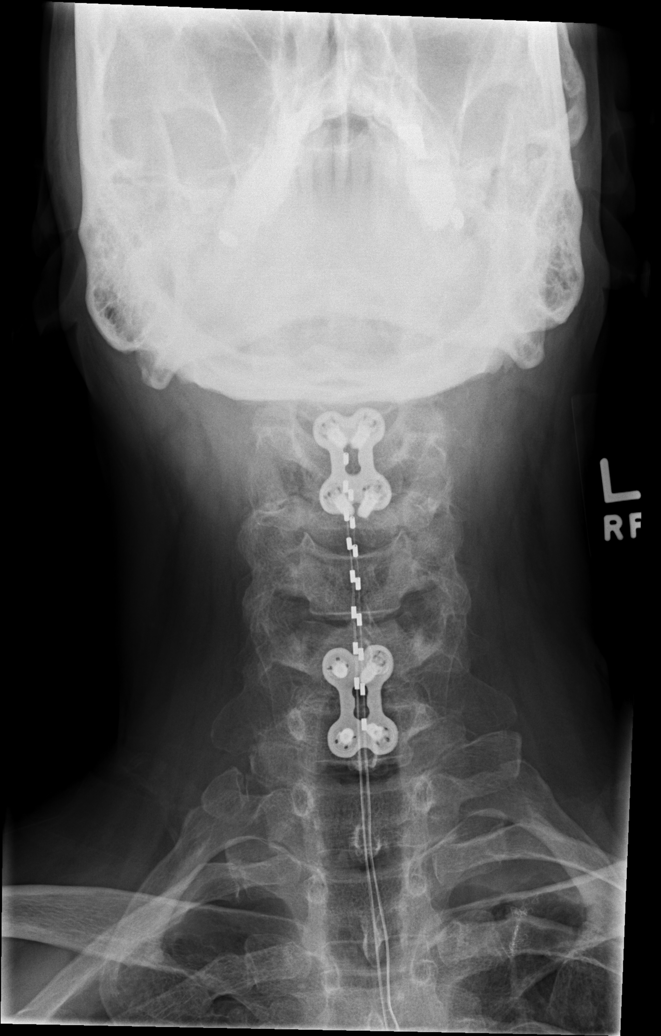
[im 5/5]
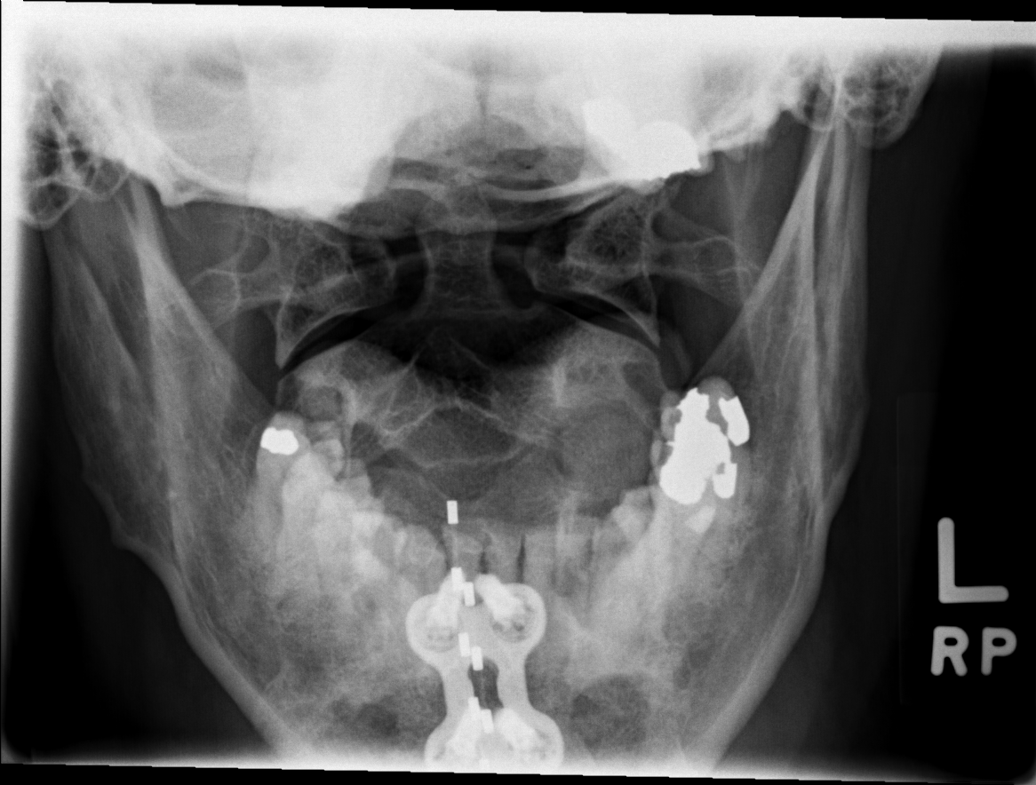

[5 of 5 positions shown; findings below may reference images not displayed]

PROCEDURE:     DXR - DXR CERVICAL SPINE COMPLETE  - July 05, 2012  [DATE]

RESULT:     Previous anterior cervical fusion surgery with plate and screws
is seen from C3 to C4 and from C6 to C7. There is a stimulation catheter of
the spinal canal region. There is osteopenia. Facet arthropathy is present.
The atlantoaxial alignment is maintained. The hardware appears intact. There
is no definite fracture. No bony destruction is appreciated. The foramina
are not well seen on the left side because of the overlying catheter or
stimulation lead. The right foramina appear patent.
IMPRESSION: Postoperative changes. No acute bony abnormality.

[REDACTED]
